# Patient Record
Sex: Male | Born: 1990 | Race: Black or African American | Hispanic: No | Marital: Single | Smoking: Former smoker
Health system: Southern US, Community
[De-identification: ages and names within clinical notes are randomized; demographics above are authoritative.]

## PROBLEM LIST (undated history)

## (undated) DIAGNOSIS — I10 Essential (primary) hypertension: Secondary | ICD-10-CM

## (undated) DIAGNOSIS — J45909 Unspecified asthma, uncomplicated: Secondary | ICD-10-CM

## (undated) DIAGNOSIS — E119 Type 2 diabetes mellitus without complications: Secondary | ICD-10-CM

## (undated) HISTORY — PX: LEG SURGERY: SHX1003

---

## 2009-09-21 ENCOUNTER — Emergency Department (HOSPITAL_COMMUNITY): Admission: EM | Admit: 2009-09-21 | Discharge: 2009-09-21 | Payer: Self-pay | Admitting: Emergency Medicine

## 2013-02-06 ENCOUNTER — Emergency Department (HOSPITAL_COMMUNITY)
Admission: EM | Admit: 2013-02-06 | Discharge: 2013-02-06 | Disposition: A | Discharge status: Home / Self Care | Payer: Self-pay | Attending: Emergency Medicine | Admitting: Emergency Medicine

## 2013-02-06 ENCOUNTER — Encounter (HOSPITAL_COMMUNITY): Payer: Self-pay | Admitting: Emergency Medicine

## 2013-02-06 DIAGNOSIS — X19XXXA Contact with other heat and hot substances, initial encounter: Secondary | ICD-10-CM | POA: Insufficient documentation

## 2013-02-06 DIAGNOSIS — Z79899 Other long term (current) drug therapy: Secondary | ICD-10-CM | POA: Insufficient documentation

## 2013-02-06 DIAGNOSIS — E119 Type 2 diabetes mellitus without complications: Secondary | ICD-10-CM | POA: Insufficient documentation

## 2013-02-06 DIAGNOSIS — Y93G3 Activity, cooking and baking: Secondary | ICD-10-CM | POA: Insufficient documentation

## 2013-02-06 DIAGNOSIS — J45909 Unspecified asthma, uncomplicated: Secondary | ICD-10-CM | POA: Insufficient documentation

## 2013-02-06 DIAGNOSIS — T24209A Burn of second degree of unspecified site of unspecified lower limb, except ankle and foot, initial encounter: Secondary | ICD-10-CM | POA: Insufficient documentation

## 2013-02-06 DIAGNOSIS — T24202A Burn of second degree of unspecified site of left lower limb, except ankle and foot, initial encounter: Secondary | ICD-10-CM

## 2013-02-06 DIAGNOSIS — Y929 Unspecified place or not applicable: Secondary | ICD-10-CM | POA: Insufficient documentation

## 2013-02-06 DIAGNOSIS — I10 Essential (primary) hypertension: Secondary | ICD-10-CM | POA: Insufficient documentation

## 2013-02-06 HISTORY — DX: Unspecified asthma, uncomplicated: J45.909

## 2013-02-06 HISTORY — DX: Type 2 diabetes mellitus without complications: E11.9

## 2013-02-06 HISTORY — DX: Essential (primary) hypertension: I10

## 2013-02-06 MED ORDER — SILVER SULFADIAZINE 1 % EX CREA: TOPICAL_CREAM | Freq: Every day | CUTANEOUS | Status: DC

## 2013-02-06 MED ORDER — OXYCODONE-ACETAMINOPHEN 5-325 MG PO TABS
2.0000 | ORAL_TABLET | Freq: Once | ORAL | Status: AC
  Administered 2013-02-06: 2 via ORAL
  Filled 2013-02-06: qty 2

## 2013-02-06 MED ORDER — SILVER SULFADIAZINE 1 % EX CREA
TOPICAL_CREAM | Freq: Once | CUTANEOUS | Status: AC
  Administered 2013-02-06: 1 via TOPICAL
  Filled 2013-02-06: qty 85

## 2013-02-06 MED ORDER — HYDROCODONE-ACETAMINOPHEN 5-325 MG PO TABS: 1.0000 | ORAL_TABLET | Freq: Four times a day (QID) | ORAL | Status: DC | PRN
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
## 2013-02-06 NOTE — Discharge Instructions (Signed)
Apply Silvadene as prescribed follow by application of a clean dry dressing. Recommend ice to the affected area to prevent further swelling or inflammation. You may take ibuprofen 600 mg every 6 hours as needed for mild to moderate pain. You may take Norco as prescribed for severe pain. Return to emergency department if symptoms worsen such as if you develop a fever, signs of infection develop, increased numbness to your left lower extremity, if your skin becomes pale, or if your left leg becomes cold to the touch.  Burn Care Your skin is a natural barrier to infection. It is the largest organ of your body. Burns damage this natural protection. To help prevent infection, it is very important to follow your caregiver's instructions in the care of your burn. Burns are classified as:  First degree. There is only redness of the skin (erythema). No scarring is expected.  Second degree. There is blistering of the skin. Scarring may occur with deeper burns.  Third degree. All layers of the skin are injured, and scarring is expected. HOME CARE INSTRUCTIONS   Wash your hands well before changing your bandage.  Change your bandage as often as directed by your caregiver.  Remove the old bandage. If the bandage sticks, you may soak it off with cool, clean water.  Cleanse the burn thoroughly but gently with mild soap and water.  Pat the area dry with a clean, dry cloth.  Apply a thin layer of antibacterial cream to the burn.  Apply a clean bandage as instructed by your caregiver.  Keep the bandage as clean and dry as possible.  Elevate the affected area for the first 24 hours, then as instructed by your caregiver.  Only take over-the-counter or prescription medicines for pain, discomfort, or fever as directed by your caregiver. SEEK IMMEDIATE MEDICAL CARE IF:   You develop excessive pain.  You develop redness, tenderness, swelling, or red streaks near the burn.  The burned area develops  yellowish-white fluid (pus) or a bad smell.  You have a fever. MAKE SURE YOU:   Understand these instructions.  Will watch your condition.  Will get help right away if you are not doing well or get worse. Document Released: 06/03/2005 Document Revised: 08/26/2011 Document Reviewed: 10/24/2010 [facility] Patient Information 2014 [place], [place].
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
## 2013-02-06 NOTE — ED Provider Notes (Signed)
Medical screening examination/treatment/procedure(s) were performed by non-physician practitioner and as supervising physician I was immediately available for consultation/collaboration.  Clela Hagadorn R. Alycea Segoviano, MD 02/06/13 2240 

## 2018-06-22 ENCOUNTER — Encounter (HOSPITAL_COMMUNITY): Payer: Self-pay | Admitting: *Deleted

## 2018-06-22 ENCOUNTER — Inpatient Hospital Stay (HOSPITAL_COMMUNITY)
Admission: EM | Admit: 2018-06-22 | Discharge: 2018-06-26 | DRG: 638 | Disposition: A | Discharge status: Home / Self Care | Payer: Self-pay | Attending: Internal Medicine | Admitting: Internal Medicine

## 2018-06-22 ENCOUNTER — Emergency Department (HOSPITAL_COMMUNITY): Payer: Self-pay

## 2018-06-22 ENCOUNTER — Other Ambulatory Visit: Payer: Self-pay

## 2018-06-22 DIAGNOSIS — E1110 Type 2 diabetes mellitus with ketoacidosis without coma: Principal | ICD-10-CM

## 2018-06-22 DIAGNOSIS — Z7984 Long term (current) use of oral hypoglycemic drugs: Secondary | ICD-10-CM

## 2018-06-22 DIAGNOSIS — E876 Hypokalemia: Secondary | ICD-10-CM | POA: Diagnosis present

## 2018-06-22 DIAGNOSIS — Z833 Family history of diabetes mellitus: Secondary | ICD-10-CM

## 2018-06-22 DIAGNOSIS — Z91040 Latex allergy status: Secondary | ICD-10-CM

## 2018-06-22 DIAGNOSIS — Z79899 Other long term (current) drug therapy: Secondary | ICD-10-CM

## 2018-06-22 DIAGNOSIS — Z6841 Body Mass Index (BMI) 40.0 and over, adult: Secondary | ICD-10-CM

## 2018-06-22 DIAGNOSIS — I10 Essential (primary) hypertension: Secondary | ICD-10-CM | POA: Diagnosis present

## 2018-06-22 DIAGNOSIS — Z9114 Patient's other noncompliance with medication regimen: Secondary | ICD-10-CM

## 2018-06-22 DIAGNOSIS — E860 Dehydration: Secondary | ICD-10-CM | POA: Diagnosis present

## 2018-06-22 DIAGNOSIS — J45909 Unspecified asthma, uncomplicated: Secondary | ICD-10-CM | POA: Diagnosis present

## 2018-06-22 DIAGNOSIS — R000 Tachycardia, unspecified: Secondary | ICD-10-CM | POA: Diagnosis present

## 2018-06-22 DIAGNOSIS — K219 Gastro-esophageal reflux disease without esophagitis: Secondary | ICD-10-CM | POA: Diagnosis present

## 2018-06-22 DIAGNOSIS — E1310 Other specified diabetes mellitus with ketoacidosis without coma: Secondary | ICD-10-CM

## 2018-06-22 HISTORY — DX: Type 2 diabetes mellitus with ketoacidosis without coma: E11.10

## 2018-06-22 LAB — CBC WITH DIFFERENTIAL/PLATELET
Abs Immature Granulocytes: 0.15 10*3/uL — ABNORMAL HIGH (ref 0.00–0.07)
BASOS ABS: 0.1 10*3/uL (ref 0.0–0.1)
BASOS PCT: 0 %
EOS PCT: 0 %
Eosinophils Absolute: 0 10*3/uL (ref 0.0–0.5)
HCT: 50.3 % (ref 39.0–52.0)
Hemoglobin: 16.5 g/dL (ref 13.0–17.0)
IMMATURE GRANULOCYTES: 1 %
Lymphocytes Relative: 9 %
Lymphs Abs: 1.6 10*3/uL (ref 0.7–4.0)
MCH: 29.9 pg (ref 26.0–34.0)
MCHC: 32.8 g/dL (ref 30.0–36.0)
MCV: 91.3 fL (ref 80.0–100.0)
Monocytes Absolute: 1.8 10*3/uL — ABNORMAL HIGH (ref 0.1–1.0)
Monocytes Relative: 11 %
NEUTROS PCT: 79 %
NRBC: 0 % (ref 0.0–0.2)
Neutro Abs: 13 10*3/uL — ABNORMAL HIGH (ref 1.7–7.7)
PLATELETS: 254 10*3/uL (ref 150–400)
RBC: 5.51 MIL/uL (ref 4.22–5.81)
RDW: 13.7 % (ref 11.5–15.5)
WBC: 16.6 10*3/uL — AB (ref 4.0–10.5)

## 2018-06-22 LAB — URINALYSIS, ROUTINE W REFLEX MICROSCOPIC
Bilirubin Urine: NEGATIVE
Glucose, UA: >=500 mg/dL — AB
Ketones, ur: 80 mg/dL — AB
LEUKOCYTES UA: NEGATIVE
NITRITE: NEGATIVE
PROTEIN: 100 mg/dL — AB
SPECIFIC GRAVITY, URINE: 1.022 (ref 1.005–1.030)
pH: 5 (ref 5.0–8.0)

## 2018-06-22 LAB — COMPREHENSIVE METABOLIC PANEL
ALT: 38 U/L (ref 0–44)
AST: 24 U/L (ref 15–41)
Albumin: 4.7 g/dL (ref 3.5–5.0)
Alkaline Phosphatase: 92 U/L (ref 38–126)
BILIRUBIN TOTAL: 2.1 mg/dL — AB (ref 0.3–1.2)
BUN: 10 mg/dL (ref 6–20)
CO2: <7 mmol/L — ABNORMAL LOW (ref 22–32)
Calcium: 9.3 mg/dL (ref 8.9–10.3)
Chloride: 101 mmol/L (ref 98–111)
Creatinine, Ser: 1.67 mg/dL — ABNORMAL HIGH (ref 0.61–1.24)
GFR, EST NON AFRICAN AMERICAN: 54 mL/min — AB (ref >60)
Glucose, Bld: 509 mg/dL (ref 70–99)
Potassium: 5.1 mmol/L (ref 3.5–5.1)
SODIUM: 131 mmol/L — AB (ref 135–145)
TOTAL PROTEIN: 9.1 g/dL — AB (ref 6.5–8.1)

## 2018-06-22 LAB — LIPASE, BLOOD: LIPASE: 57 U/L — AB (ref 11–51)

## 2018-06-22 LAB — I-STAT TROPONIN, ED: Troponin i, poc: 0 ng/mL (ref 0.00–0.08)

## 2018-06-22 LAB — CBG MONITORING, ED
GLUCOSE-CAPILLARY: 466 mg/dL — AB (ref 70–99)
Glucose-Capillary: 475 mg/dL — ABNORMAL HIGH (ref 70–99)
Glucose-Capillary: 387 mg/dL — ABNORMAL HIGH (ref 70–99)
Glucose-Capillary: 340 mg/dL — ABNORMAL HIGH (ref 70–99)
Glucose-Capillary: 314 mg/dL — ABNORMAL HIGH (ref 70–99)
Glucose-Capillary: 277 mg/dL — ABNORMAL HIGH (ref 70–99)
Glucose-Capillary: 261 mg/dL — ABNORMAL HIGH (ref 70–99)

## 2018-06-22 LAB — I-STAT CG4 LACTIC ACID, ED: Lactic Acid, Venous: 2.88 mmol/L (ref 0.5–1.9)

## 2018-06-22 MED ORDER — LORATADINE 10 MG PO TABS
10.0000 mg | ORAL_TABLET | Freq: Every day | ORAL | Status: DC
  Filled 2018-06-22 (×4): qty 1

## 2018-06-22 MED ORDER — ENOXAPARIN SODIUM 40 MG/0.4ML ~~LOC~~ SOLN
40.0000 mg | SUBCUTANEOUS | Status: DC
  Administered 2018-06-23: 40 mg via SUBCUTANEOUS
  Filled 2018-06-22: qty 0.4

## 2018-06-22 MED ORDER — INSULIN REGULAR(HUMAN) IN NACL 100-0.9 UT/100ML-% IV SOLN
INTRAVENOUS | Status: DC
  Administered 2018-06-22: 10.1 [IU]/h via INTRAVENOUS
  Filled 2018-06-22: qty 100

## 2018-06-22 MED ORDER — SODIUM CHLORIDE 0.9 % IV BOLUS
1000.0000 mL | Freq: Once | INTRAVENOUS | Status: AC
  Administered 2018-06-22: 1000 mL via INTRAVENOUS

## 2018-06-22 MED ORDER — INSULIN REGULAR(HUMAN) IN NACL 100-0.9 UT/100ML-% IV SOLN
INTRAVENOUS | Status: DC
  Administered 2018-06-22: 4.2 [IU]/h via INTRAVENOUS
  Filled 2018-06-22: qty 100

## 2018-06-22 MED ORDER — SODIUM CHLORIDE 0.9 % IV SOLN
INTRAVENOUS | Status: DC
  Administered 2018-06-22: via INTRAVENOUS

## 2018-06-22 MED ORDER — SODIUM CHLORIDE 0.9 % IV BOLUS
500.0000 mL | Freq: Once | INTRAVENOUS | Status: AC
  Administered 2018-06-22: 500 mL via INTRAVENOUS

## 2018-06-22 MED ORDER — DEXTROSE-NACL 5-0.45 % IV SOLN
INTRAVENOUS | Status: DC
  Administered 2018-06-22: via INTRAVENOUS

## 2018-06-22 MED ORDER — SODIUM CHLORIDE 0.9 % IV SOLN
INTRAVENOUS | Status: AC
  Administered 2018-06-22: via INTRAVENOUS

## 2018-06-22 NOTE — ED Triage Notes (Signed)
Pt in c/o acid reflux and fever since new years eve, denies cough, no distress noted

## 2018-06-22 NOTE — ED Provider Notes (Signed)
[facility] EMERGENCY DEPARTMENT Provider Note   CSN: [id number] Arrival date & time: 06/22/18  1113     History   Chief Complaint Chief Complaint  Patient presents with  . Gastroesophageal Reflux    HPI Louis [name] is a 28 y.o. male presenting for evaluation of polydipsia, polyuria, acid reflux, and overall weakness.   Pt states for the past 6 days, he has been feeling very poorly. He has tried to change his diet, drinking more water and decreasing his sodium and sugar intact. He states he is always thirsty and urinating frequently. He has acid-reflux like bringing at the epigastrium, which improved with nexium 2 days ago. He reports mild nausea and episodes of regurgitation. He states he has no medical problems, but upon discussion of his history, pt states haft in highschool he was on metformin for borderline/mild diabetes. He has not been on any medicine in the past few years, has no PCP. He denies fevers, chills, CP, SOB, abd pain, or abnormal BMs.   No additional history in the EMR for review.    HPI  Past Medical History:  Diagnosis Date  . Asthma   . Diabetes mellitus without complication (HCC)   . Hypertension     Patient Active Problem List   Diagnosis Date Noted  . Diabetic ketoacidosis (HCC) 06/22/2018    Past Surgical History:  Procedure Laterality Date  . LEG SURGERY          Home Medications    Prior to Admission medications   Medication Sig Start Date End Date Taking? Authorizing Provider  CALCIUM PO Take 1 tablet by mouth daily.    [provider]  HYDROcodone-acetaminophen (NORCO/VICODIN) 5-325 MG per tablet Take 1-2 tablets by mouth every 6 (six) hours as needed for pain. 02/06/13   Antony Madura, PA-C  loratadine (CLARITIN) 10 MG tablet Take 10 mg by mouth daily.    [provider]  Multiple Vitamins-Minerals (MULTIVITAMIN PO) Take 1 tablet by mouth daily.    [provider]  silver sulfADIAZINE  (SILVADENE) 1 % cream Apply topically daily. Apply to affected burn area twice per day and cover with a clean, dry gauze dressing. 02/06/13   Antony Madura, PA-C    Family History History reviewed. No pertinent family history.  Social History Social History   Tobacco Use  . Smoking status: Never Smoker  Substance Use Topics  . Alcohol use: Yes  . Drug use: Yes    Types: Marijuana     Allergies   Latex   Review of Systems Review of Systems  Constitutional: Positive for fatigue.  Gastrointestinal: Positive for nausea and vomiting.  Endocrine: Positive for polydipsia and polyuria.  Neurological: Positive for weakness.  All other systems reviewed and are negative.    Physical Exam Updated Vital Signs BP (!) 157/114   Pulse (!) 133   Temp 97.9 F (36.6 C) (Oral)   Resp (!) 28   SpO2 97%   Physical Exam Vitals signs and nursing note reviewed.  Constitutional:      Appearance: He is well-developed.     Comments: Obese male who appears dehydrated  HENT:     Head: Normocephalic and atraumatic.     Mouth/Throat:     Mouth: Mucous membranes are dry.  Eyes:     Conjunctiva/sclera: Conjunctivae normal.     Pupils: Pupils are equal, round, and reactive to light.  Neck:     Musculoskeletal: Normal range of motion and neck supple.  Cardiovascular:     Rate and Rhythm: Regular rhythm. Tachycardia present.     Comments: Tachycardic around 140 Pulmonary:     Effort: Pulmonary effort is normal. Tachypnea present. No respiratory distress.     Breath sounds: Normal breath sounds. No wheezing.     Comments: tachypneic around 26. Speaking in full sentences. Clear lung sounds in all fields.  Abdominal:     General: There is no distension.     Palpations: Abdomen is soft. There is no mass.     Tenderness: There is no abdominal tenderness. There is no guarding.     Comments: No TTP of the abd  Musculoskeletal: Normal range of motion.  Skin:    General: Skin is warm and dry.      Capillary Refill: Capillary refill takes less than 2 seconds.  Neurological:     Mental Status: He is alert and oriented to person, place, and time.      ED Treatments / Results  Labs (all labs ordered are listed, but only abnormal results are displayed) Labs Reviewed  COMPREHENSIVE METABOLIC PANEL - Abnormal; Notable for the following components:      Result Value   Sodium 131 (*)    CO2 <7 (*)    Glucose, Bld 509 (*)    Creatinine, Ser 1.67 (*)    Total Protein 9.1 (*)    Total Bilirubin 2.1 (*)    GFR calc non Af Amer 54 (*)    All other components within normal limits  CBC WITH DIFFERENTIAL/PLATELET - Abnormal; Notable for the following components:   WBC 16.6 (*)    Neutro Abs 13.0 (*)    Monocytes Absolute 1.8 (*)    Abs Immature Granulocytes 0.15 (*)    All other components within normal limits  URINALYSIS, ROUTINE W REFLEX MICROSCOPIC - Abnormal; Notable for the following components:   Color, Urine STRAW (*)    Glucose, UA >=500 (*)    Hgb urine dipstick MODERATE (*)    Ketones, ur 80 (*)    Protein, ur 100 (*)    Bacteria, UA RARE (*)    All other components within normal limits  LIPASE, BLOOD - Abnormal; Notable for the following components:   Lipase 57 (*)    All other components within normal limits  I-STAT CG4 LACTIC ACID, ED - Abnormal; Notable for the following components:   Lactic Acid, Venous 2.88 (*)    All other components within normal limits  CBG MONITORING, ED - Abnormal; Notable for the following components:   Glucose-Capillary 466 (*)    All other components within normal limits  BLOOD GAS, VENOUS  HEMOGLOBIN A1C  I-STAT TROPONIN, ED  I-STAT CG4 LACTIC ACID, ED    EKG EKG Interpretation  Date/Time:  Monday June 22 2018 12:18:35 EST Ventricular Rate:  131 PR Interval:    QRS Duration: 100 QT Interval:  292 QTC Calculation: 431 R Axis:   62 Text Interpretation:  Sinus tachycardia Nonspecific T abnormalities, inferior leads No old  tracing to compare Confirmed by Azalia Bilis ([id number]) on 06/22/2018 12:32:06 PM   Radiology Dg Chest 2 View  Result Date: 06/22/2018 CLINICAL DATA:  Midline chest pain, nausea/vomiting EXAM: CHEST - 2 VIEW COMPARISON:  None. FINDINGS: Lungs are clear.  No pleural effusion or pneumothorax. The heart is normal in size. Visualized osseous structures are within normal limits. IMPRESSION: Normal chest radiographs. Electronically Signed   By: Charline Bills M.D.   On: 06/22/2018 13:06  Procedures .Critical Care Performed by: Alveria Apleyaccavale, Tu Shimmel, PA-C Authorized by: Alveria Apleyaccavale, Gaetan Spieker, PA-C   Critical care provider statement:    Critical care time (minutes):  45   Critical care time was exclusive of:  Separately billable procedures and treating other patients and teaching time   Critical care was necessary to treat or prevent imminent or life-threatening deterioration of the following conditions:  Metabolic crisis   Critical care was time spent personally by me on the following activities:  Blood draw for specimens, development of treatment plan with patient or surrogate, evaluation of patient's response to treatment, examination of patient, obtaining history from patient or surrogate, ordering and performing treatments and interventions, ordering and review of radiographic studies, ordering and review of laboratory studies, pulse oximetry, re-evaluation of patient's condition and review of old charts   I assumed direction of critical care for this patient from another provider in my specialty: no   Comments:     Patient in DKA, insulin drip and fluids started.   (including critical care time)  Medications Ordered in ED Medications  insulin regular, human (MYXREDLIN) 100 units/ 100 mL infusion (has no administration in time range)  sodium chloride 0.9 % bolus 1,000 mL (has no administration in time range)  sodium chloride 0.9 % bolus 500 mL (0 mLs Intravenous Stopped 06/22/18 1423)     Initial  Impression / Assessment and Plan / ED Course  I have reviewed the triage vital signs and the nursing notes.  Pertinent labs & imaging results that were available during my care of the patient were reviewed by me and considered in my medical decision making (see chart for details).     Patient presenting for evaluation of generalized weakness, polyuria, polydipsia, and symptoms consistent with acid reflux.  On exam, I am concerned about his blood sugars.  He appears very dehydrated, is tachycardic, and has Kussmaul-like breathing.  I am concerned about diabetes, possible DKA.  As such, will obtain labs, urine, chest x-ray, EKG, and reassess. Fluids started.  Additionally, patient states he has been regurgitating food.  Consider possible bowel obstruction, although patient without history of abdominal surgeries.  Labs concerning, elevated white count at 16.6.  Glucose elevated at 509.  Bicarb less than 7 and anion gap on calculable.  Lactic mildly elevated.  Ketones in the urine.  As such, patient meets DKA criteria.  He is remaining persistently tachycardic.  Troponin negative, and EKG reassuring.  Chest x-ray viewed interpreted by me, no pneumonia, no pneumothorax, effusion, cardiomegaly.  Insulin drip started, and larger fluid bolus started.  Will obtain venous blood gas and hemoglobin A1c.  Case discussed with attending, Dr. Patria Maneampos evaluated the patient.  Will call for admission.  Discussed with Dr. Sharyon MedicusHijazi from [facility], patient to be admitted.   Final Clinical Impressions(s) / ED Diagnoses   Final diagnoses:  Diabetic ketoacidosis without coma associated with other specified diabetes mellitus [facility](HCC)    ED Discharge Orders    None       Alveria ApleyCaccavale, Mikail Goostree, PA-C 06/22/18 1524    Azalia Bilisampos, Kevin, MD 06/22/18 1705

## 2018-06-22 NOTE — ED Notes (Signed)
Patient transported to X-ray 

## 2018-06-22 NOTE — H&P (Signed)
[facility]                                                                                    Patient Demographics  Louis [name], is a 28 y.o. male  CSN: [id number]  MRN: [id number]  DOB - 09-01-1990  Admit Date - 06/22/2018  Outpatient Primary MD for the patient is Fleet ContrasAvbuere, Edwin, MD   With History of -  Past Medical History:  Diagnosis Date  . Asthma   . Diabetes mellitus without complication (HCC)   . Hypertension       Past Surgical History:  Procedure Laterality Date  . LEG SURGERY      in for   Chief Complaint  Patient presents with  . Gastroesophageal Reflux     HPI  Louis [name]  is a 28 y.o. male, with with past medical history significant for diabetes mellitus in childhood, noncompliant with his metformin presenting with 2 days history of polyuria, polydipsia, nausea and vomiting and GERD.  Patient denies any chest pains or shortness of breath. In the emergency room the patient was noted to be in DKA with elevated blood sugar and anion gap and a CO2 of less than 7.  His heart rate was 142 .    Review of Systems    In addition to the HPI above,  No Fever-chills, No Headache, No changes with Vision or hearing, No problems swallowing food or Liquids, No Chest pain, Cough or Shortness of Breath, No Abdominal pain, No Nausea or Vommitting, Bowel movements are regular, No Blood in stool or Urine, No dysuria, No new skin rashes or bruises, No new joints pains-aches,  No new weakness, tingling, numbness in any extremity, No recent weight gain or loss, No polyuria, polydypsia or polyphagia, No significant Mental Stressors.  A full 10 point Review of Systems was done, except as stated above, all other Review of Systems were negative.   Social History Social History   Tobacco Use  . Smoking status: Never Smoker  Substance Use Topics  . Alcohol use: Yes     Family History Diabetes mellitus  Prior to Admission medications    Medication Sig Start Date End Date Taking? Authorizing Provider  CALCIUM PO Take 1 tablet by mouth daily.    [provider]  HYDROcodone-acetaminophen (NORCO/VICODIN) 5-325 MG per tablet Take 1-2 tablets by mouth every 6 (six) hours as needed for pain. 02/06/13   Antony MaduraHumes, Kelly, PA-C  loratadine (CLARITIN) 10 MG tablet Take 10 mg by mouth daily.    [provider]  Multiple Vitamins-Minerals (MULTIVITAMIN PO) Take 1 tablet by mouth daily.    [provider]  silver sulfADIAZINE (SILVADENE) 1 % cream Apply topically daily. Apply to affected burn area twice per day and cover with a clean, dry gauze dressing. 02/06/13   Antony MaduraHumes, Kelly, PA-C    Allergies  Allergen Reactions  . Latex Rash    Physical Exam  Vitals  Blood pressure (!) 157/114, pulse (!) 133, temperature 97.9 F (36.6 C), temperature source Oral, resp. rate (!) 28, SpO2 97 %.   1. General obese male, very pleasant  2. Normal affect and insight, Not Suicidal  or Homicidal, Awake Alert, Oriented X 3.  3. No F.N deficits, grossly.  4. Ears and Eyes appear Normal, Conjunctivae clear, PERRLA. Moist Oral Mucosa.  5. Supple Neck, No JVD, No cervical lymphadenopathy appriciated, No Carotid Bruits.  6. Symmetrical Chest wall movement, Good air movement bilaterally, CTAB.  7. RRR, tachycardia.  8. Positive Bowel Sounds, Abdomen Soft, Non tender, No organomegaly appriciated,No rebound -guarding or rigidity.  9.  No Cyanosis, Normal Skin Turgor, No Skin Rash or Bruise.  10. Good muscle tone,  joints appear normal , no effusions, Normal ROM.    Data Review  CBC Recent Labs  Lab 06/22/18 1120  WBC 16.6*  HGB 16.5  HCT 50.3  PLT 254  MCV 91.3  MCH 29.9  MCHC 32.8  RDW 13.7  LYMPHSABS 1.6  MONOABS 1.8*  EOSABS 0.0  BASOSABS 0.1   ------------------------------------------------------------------------------------------------------------------  Chemistries  Recent Labs  Lab  06/22/18 1120  NA 131*  K 5.1  CL 101  CO2 <7*  GLUCOSE 509*  BUN 10  CREATININE 1.67*  CALCIUM 9.3  AST 24  ALT 38  ALKPHOS 92  BILITOT 2.1*   ------------------------------------------------------------------------------------------------------------------ CrCl cannot be calculated (Unknown ideal weight.). ------------------------------------------------------------------------------------------------------------------ No results for input(s): TSH, T4TOTAL, T3FREE, THYROIDAB in the last 72 hours.  Invalid input(s): FREET3   Coagulation profile No results for input(s): INR, PROTIME in the last 168 hours. ------------------------------------------------------------------------------------------------------------------- No results for input(s): DDIMER in the last 72 hours. -------------------------------------------------------------------------------------------------------------------  Cardiac Enzymes No results for input(s): CKMB, TROPONINI, MYOGLOBIN in the last 168 hours.  Invalid input(s): CK ------------------------------------------------------------------------------------------------------------------ Invalid input(s): POCBNP   ---------------------------------------------------------------------------------------------------------------  Urinalysis    Component Value Date/Time   COLORURINE STRAW (A) 06/22/2018 1120   APPEARANCEUR CLEAR 06/22/2018 1120   LABSPEC 1.022 06/22/2018 1120   PHURINE 5.0 06/22/2018 1120   GLUCOSEU >=500 (A) 06/22/2018 1120   HGBUR MODERATE (A) 06/22/2018 1120   BILIRUBINUR NEGATIVE 06/22/2018 1120   KETONESUR 80 (A) 06/22/2018 1120   PROTEINUR 100 (A) 06/22/2018 1120   NITRITE NEGATIVE 06/22/2018 1120   LEUKOCYTESUR NEGATIVE 06/22/2018 1120    ----------------------------------------------------------------------------------------------------------------   Imaging results:   Dg Chest 2 View  Result Date:  06/22/2018 CLINICAL DATA:  Midline chest pain, nausea/vomiting EXAM: CHEST - 2 VIEW COMPARISON:  None. FINDINGS: Lungs are clear.  No pleural effusion or pneumothorax. The heart is normal in size. Visualized osseous structures are within normal limits. IMPRESSION: Normal chest radiographs. Electronically Signed   By: Charline Bills M.D.   On: 06/22/2018 13:06    My personal review of EKG: Sinus tach at 131  Assessment & Plan  Diabetic ketoacidosis Start with IV insulin/IV fluids per protocol  GERD Protonix  Sinus tachycardia , probably dehydration Continue with IV fluids    DVT Prophylaxis Lovenox  AM Labs Ordered, also please review Full Orders    Code Status full  Disposition Plan: Home  Time spent in minutes : 36 minutes  Condition GUARDED   @SIGNATURE @

## 2018-06-23 ENCOUNTER — Encounter (HOSPITAL_COMMUNITY): Payer: Self-pay

## 2018-06-23 DIAGNOSIS — E1310 Other specified diabetes mellitus with ketoacidosis without coma: Secondary | ICD-10-CM

## 2018-06-23 LAB — HEMOGLOBIN A1C
Hgb A1c MFr Bld: 11.6 % — ABNORMAL HIGH (ref 4.8–5.6)
Mean Plasma Glucose: 286.22 mg/dL

## 2018-06-23 LAB — BASIC METABOLIC PANEL
ANION GAP: 14 (ref 5–15)
ANION GAP: 15 (ref 5–15)
Anion gap: 15 (ref 5–15)
Anion gap: 15 (ref 5–15)
Anion gap: 14 (ref 5–15)
BUN: 9 mg/dL (ref 6–20)
BUN: 9 mg/dL (ref 6–20)
BUN: 7 mg/dL (ref 6–20)
BUN: 9 mg/dL (ref 6–20)
BUN: 7 mg/dL (ref 6–20)
CALCIUM: 9.6 mg/dL (ref 8.9–10.3)
CO2: 11 mmol/L — ABNORMAL LOW (ref 22–32)
CO2: 13 mmol/L — ABNORMAL LOW (ref 22–32)
CO2: 7 mmol/L — ABNORMAL LOW (ref 22–32)
CO2: 8 mmol/L — ABNORMAL LOW (ref 22–32)
CO2: 10 mmol/L — ABNORMAL LOW (ref 22–32)
Calcium: 9.1 mg/dL (ref 8.9–10.3)
Calcium: 9.4 mg/dL (ref 8.9–10.3)
Calcium: 9.6 mg/dL (ref 8.9–10.3)
Calcium: 9.3 mg/dL (ref 8.9–10.3)
Chloride: 107 mmol/L (ref 98–111)
Chloride: 108 mmol/L (ref 98–111)
Chloride: 112 mmol/L — ABNORMAL HIGH (ref 98–111)
Chloride: 113 mmol/L — ABNORMAL HIGH (ref 98–111)
Chloride: 108 mmol/L (ref 98–111)
Creatinine, Ser: 1.14 mg/dL (ref 0.61–1.24)
Creatinine, Ser: 1.13 mg/dL (ref 0.61–1.24)
Creatinine, Ser: 1.05 mg/dL (ref 0.61–1.24)
Creatinine, Ser: 1.1 mg/dL (ref 0.61–1.24)
Creatinine, Ser: 0.96 mg/dL (ref 0.61–1.24)
GFR calc Af Amer: >60 mL/min (ref >60)
GFR calc Af Amer: >60 mL/min (ref >60)
GFR calc Af Amer: >60 mL/min (ref >60)
GFR calc Af Amer: >60 mL/min (ref >60)
GFR calc Af Amer: >60 mL/min (ref >60)
GFR calc non Af Amer: >60 mL/min (ref >60)
GFR calc non Af Amer: >60 mL/min (ref >60)
GFR calc non Af Amer: >60 mL/min (ref >60)
GFR calc non Af Amer: >60 mL/min (ref >60)
GLUCOSE: 182 mg/dL — AB (ref 70–99)
GLUCOSE: 193 mg/dL — AB (ref 70–99)
Glucose, Bld: 196 mg/dL — ABNORMAL HIGH (ref 70–99)
Glucose, Bld: 218 mg/dL — ABNORMAL HIGH (ref 70–99)
Glucose, Bld: 163 mg/dL — ABNORMAL HIGH (ref 70–99)
POTASSIUM: 3.7 mmol/L (ref 3.5–5.1)
Potassium: 3.5 mmol/L (ref 3.5–5.1)
Potassium: 5 mmol/L (ref 3.5–5.1)
Potassium: 4.2 mmol/L (ref 3.5–5.1)
Potassium: 4 mmol/L (ref 3.5–5.1)
Sodium: 133 mmol/L — ABNORMAL LOW (ref 135–145)
Sodium: 135 mmol/L (ref 135–145)
Sodium: 134 mmol/L — ABNORMAL LOW (ref 135–145)
Sodium: 135 mmol/L (ref 135–145)
Sodium: 133 mmol/L — ABNORMAL LOW (ref 135–145)

## 2018-06-23 LAB — GLUCOSE, CAPILLARY
GLUCOSE-CAPILLARY: 192 mg/dL — AB (ref 70–99)
GLUCOSE-CAPILLARY: 125 mg/dL — AB (ref 70–99)
Glucose-Capillary: 143 mg/dL — ABNORMAL HIGH (ref 70–99)
Glucose-Capillary: 148 mg/dL — ABNORMAL HIGH (ref 70–99)
Glucose-Capillary: 151 mg/dL — ABNORMAL HIGH (ref 70–99)
Glucose-Capillary: 152 mg/dL — ABNORMAL HIGH (ref 70–99)
Glucose-Capillary: 160 mg/dL — ABNORMAL HIGH (ref 70–99)
Glucose-Capillary: 162 mg/dL — ABNORMAL HIGH (ref 70–99)
Glucose-Capillary: 164 mg/dL — ABNORMAL HIGH (ref 70–99)
Glucose-Capillary: 168 mg/dL — ABNORMAL HIGH (ref 70–99)
Glucose-Capillary: 170 mg/dL — ABNORMAL HIGH (ref 70–99)
Glucose-Capillary: 173 mg/dL — ABNORMAL HIGH (ref 70–99)
Glucose-Capillary: 177 mg/dL — ABNORMAL HIGH (ref 70–99)
Glucose-Capillary: 188 mg/dL — ABNORMAL HIGH (ref 70–99)
Glucose-Capillary: 188 mg/dL — ABNORMAL HIGH (ref 70–99)
Glucose-Capillary: 199 mg/dL — ABNORMAL HIGH (ref 70–99)
Glucose-Capillary: 201 mg/dL — ABNORMAL HIGH (ref 70–99)
Glucose-Capillary: 205 mg/dL — ABNORMAL HIGH (ref 70–99)
Glucose-Capillary: 207 mg/dL — ABNORMAL HIGH (ref 70–99)
Glucose-Capillary: 241 mg/dL — ABNORMAL HIGH (ref 70–99)

## 2018-06-23 LAB — MRSA PCR SCREENING: MRSA by PCR: NEGATIVE

## 2018-06-23 LAB — HIV ANTIBODY (ROUTINE TESTING W REFLEX): HIV Screen 4th Generation wRfx: NONREACTIVE

## 2018-06-23 MED ORDER — ADULT MULTIVITAMIN W/MINERALS CH
1.0000 | ORAL_TABLET | Freq: Every day | ORAL | Status: DC
  Administered 2018-06-23 – 2018-06-25 (×3): 1 via ORAL
  Filled 2018-06-23 (×4): qty 1

## 2018-06-23 MED ORDER — INSULIN ASPART 100 UNIT/ML ~~LOC~~ SOLN: 0.0000 [IU] | Freq: Three times a day (TID) | SUBCUTANEOUS | Status: DC

## 2018-06-23 MED ORDER — INSULIN GLARGINE 100 UNIT/ML ~~LOC~~ SOLN
12.0000 [IU] | Freq: Every day | SUBCUTANEOUS | Status: DC
  Administered 2018-06-23: 12 [IU] via SUBCUTANEOUS
  Filled 2018-06-23 (×2): qty 0.12

## 2018-06-23 MED ORDER — ENOXAPARIN SODIUM 100 MG/ML ~~LOC~~ SOLN
100.0000 mg | SUBCUTANEOUS | Status: DC
  Administered 2018-06-24 – 2018-06-25 (×2): 100 mg via SUBCUTANEOUS
  Filled 2018-06-23 (×2): qty 1

## 2018-06-23 MED ORDER — INSULIN ASPART 100 UNIT/ML ~~LOC~~ SOLN: 0.0000 [IU] | Freq: Every day | SUBCUTANEOUS | Status: DC

## 2018-06-23 MED ORDER — INSULIN REGULAR(HUMAN) IN NACL 100-0.9 UT/100ML-% IV SOLN
INTRAVENOUS | Status: DC
  Administered 2018-06-23: 7.7 [IU]/h via INTRAVENOUS
  Administered 2018-06-24: 13.4 [IU]/h via INTRAVENOUS
  Administered 2018-06-24: 7.1 [IU]/h via INTRAVENOUS
  Filled 2018-06-23 (×4): qty 100

## 2018-06-23 MED ORDER — INSULIN REGULAR BOLUS VIA INFUSION
0.0000 [IU] | Freq: Three times a day (TID) | INTRAVENOUS | Status: DC
  Administered 2018-06-23: 1 [IU] via INTRAVENOUS
  Administered 2018-06-24: 2.6 [IU] via INTRAVENOUS
  Administered 2018-06-24: 2.4 [IU] via INTRAVENOUS
  Administered 2018-06-24: 2.5 [IU] via INTRAVENOUS
  Filled 2018-06-23: qty 10

## 2018-06-23 MED ORDER — DEXTROSE-NACL 5-0.45 % IV SOLN
INTRAVENOUS | Status: DC
  Administered 2018-06-23 – 2018-06-25 (×3): via INTRAVENOUS

## 2018-06-23 MED ORDER — SODIUM CHLORIDE 0.9 % IV SOLN: INTRAVENOUS | Status: DC

## 2018-06-23 MED ORDER — ACETAMINOPHEN 325 MG PO TABS
650.0000 mg | ORAL_TABLET | Freq: Four times a day (QID) | ORAL | Status: DC | PRN
  Filled 2018-06-23: qty 2

## 2018-06-23 MED ORDER — INSULIN STARTER KIT- SYRINGES (ENGLISH)
1.0000 | Freq: Once | Status: AC
  Administered 2018-06-23: 1
  Filled 2018-06-23: qty 1

## 2018-06-23 MED ORDER — METFORMIN HCL 850 MG PO TABS
850.0000 mg | ORAL_TABLET | Freq: Two times a day (BID) | ORAL | Status: DC
  Administered 2018-06-23: 850 mg via ORAL
  Filled 2018-06-23 (×2): qty 1

## 2018-06-23 MED ORDER — LIVING WELL WITH DIABETES BOOK
Freq: Once | Status: AC
  Administered 2018-06-23: 11:00:00
  Filled 2018-06-23: qty 1

## 2018-06-23 MED ORDER — DEXTROSE 50 % IV SOLN: 25.0000 mL | INTRAVENOUS | Status: DC | PRN

## 2018-06-23 NOTE — Progress Notes (Signed)
PROGRESS NOTE                                                                                                                                                                                                             Patient Demographics:    Louis [name], is a 28 y.o. male, DOB - 1990/11/30, ZOX:[id number]  Admit date - 06/22/2018   Admitting Physician Carron CurieAli Hijazi, MD  Outpatient Primary MD for the patient is Fleet ContrasAvbuere, Edwin, MD  LOS - 1   Chief Complaint  Patient presents with  . Gastroesophageal Reflux       Brief Narrative     28 y.o. 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  In the emergency room the patient was noted to be in DKA with elevated blood sugar and anion gap and a CO2 of less than 7.  His heart rate was 142 .  Added for DKA management.    Subjective:    Louis [name] today has, No headache, No chest pain, No abdominal pain - No Nausea, No new weakness tingling or numbness, No Cough - SOB.   Assessment  & Plan :    Active Problems:   Diabetic ketoacidosis (HCC)  DKA -Report diagnosis of prediabetes when he was 28 years old, use metformin until he was 28 years old, but no other diabetic regimen since he presents with low bicarb, elevated anion gap, diagnosis for DKA, unclear if this is related to type I or type 2 diabetes, but this is most likely early onset type 2 diabetes in the setting of morbid obesity. -Patient on insulin drip overnight, his repeat BMP this morning showing anion gap of 14, and bicarb of 13, was started on subcutaneous Lantus, insulin sliding scale, protocol not appropriate to turn the drip yet, so he will be resumed back on insulin drip for today, will close monitoring of his BMP. -Continue with IV fluids. -Likely will need to be discharged on long-acting insulin, and metformin,  cussed with case management, who will [facility]. -A1C elevated at 11.6  GERD Protonix  Sinus tachycardia , probably dehydration Continue with IV fluids   Code Status :Full  Family Communication  : None at bedside  Disposition Plan  :home when stable  Barriers For Discharge : Remains elevated anion gap, low bicarb level, still needs saline drip  Consults  :  None  Procedures  : None  DVT Prophylaxis  :  [place] lovenox  Lab Results  Component Value Date   PLT 254 06/22/2018    Antibiotics  :    Anti-infectives (From admission, onward)   None        Objective:   Vitals:   06/23/18 0000 06/23/18 0010 06/23/18 0608 06/23/18 0844  BP:   (!) 146/96 (!) 142/106  Pulse: (!) 113  (!) 112 (!) 114  Resp: (!) 25   15  Temp:    98.5 F (36.9 C)  TempSrc:    Oral  SpO2: 98%  97% 99%  Weight:  (!) 209.6 kg    Height:  5\' 10"  (1.778 m)      Wt Readings from Last 3 Encounters:  06/23/18 (!) 209.6 kg  02/06/13 (!) 191.7 kg     Intake/Output Summary (Last 24 hours) at 06/23/2018 1244 Last data filed at 06/23/2018 0323 Gross per 24 hour  Intake 2859.76 ml  Output 2430 ml  Net 429.76 ml     Physical Exam  Awake Alert, Oriented X 3, No new F.N deficits, Normal affect Symmetrical Chest wall movement, Good air movement bilaterally, CTAB RRR,No Gallops,Rubs or new Murmurs, No Parasternal Heave +ve B.Sounds, Abd Soft, No tenderness, No rebound - guarding or rigidity. No Cyanosis, Clubbing or edema, No new Rash or bruise      Data Review:    CBC Recent Labs  Lab 06/22/18 1120  WBC 16.6*  HGB 16.5  HCT 50.3  PLT 254  MCV 91.3  MCH 29.9  MCHC 32.8  RDW 13.7  LYMPHSABS 1.6  MONOABS 1.8*  EOSABS 0.0  BASOSABS 0.1    Chemistries  Recent Labs  Lab 06/22/18 1120 06/23/18 0432 06/23/18 0759  NA 131* 133* 135  K 5.1 3.7 3.5  CL 101 107 108  CO2 <7* 11* 13*  GLUCOSE 509* 196* 182*  BUN 10 9 9   CREATININE  1.67* 1.14 1.13  CALCIUM 9.3 9.1 9.4  AST 24  --   --   ALT 38  --   --   ALKPHOS 92  --   --   BILITOT 2.1*  --   --    ------------------------------------------------------------------------------------------------------------------ No results for input(s): CHOL, HDL, LDLCALC, TRIG, CHOLHDL, LDLDIRECT in the last 72 hours.  Lab Results  Component Value Date   HGBA1C 11.6 (H) 06/23/2018   ------------------------------------------------------------------------------------------------------------------ No results for input(s): TSH, T4TOTAL, T3FREE, THYROIDAB in the last 72 hours.  Invalid input(s): FREET3 ------------------------------------------------------------------------------------------------------------------ No results for input(s): VITAMINB12, FOLATE, FERRITIN, TIBC, IRON, RETICCTPCT in the last 72 hours.  Coagulation profile No results for input(s): INR, PROTIME in the last 168 hours.  No results for input(s): DDIMER in the last 72 hours.  Cardiac Enzymes No results for input(s): CKMB, TROPONINI, MYOGLOBIN in the last 168 hours.  Invalid input(s): CK ------------------------------------------------------------------------------------------------------------------ No results found for: BNP  Inpatient Medications  Scheduled Meds: . enoxaparin (LOVENOX) injection  40 mg Subcutaneous Q24H  . insulin glargine  12 Units Subcutaneous Daily  . insulin regular  0-10 Units Intravenous TID WC  . loratadine  10 mg Oral Daily  . metFORMIN  850 mg Oral BID WC   Continuous Infusions: . sodium chloride 150 mL/hr at 06/23/18 1048  . sodium chloride    . dextrose 5 % and 0.45%  NaCl    . insulin     PRN Meds:.dextrose  Micro Results Recent Results (from the past 240 hour(s))  MRSA PCR Screening     Status: None   Collection Time: 06/22/18 11:55 PM  Result Value Ref Range Status   MRSA by PCR NEGATIVE NEGATIVE Final    Comment:        The GeneXpert MRSA Assay  (FDA approved for NASAL specimens only), is one component of a comprehensive MRSA colonization surveillance program. It is not intended to diagnose MRSA infection nor to guide or monitor treatment for MRSA infections. Performed at [facility], [address] [place], [place] [address]     Radiology Reports Dg Chest 2 View  Result Date: 06/22/2018 CLINICAL DATA:  Midline chest pain, nausea/vomiting EXAM: CHEST - 2 VIEW COMPARISON:  None. FINDINGS: Lungs are clear.  No pleural effusion or pneumothorax. The heart is normal in size. Visualized osseous structures are within normal limits. IMPRESSION: Normal chest radiographs. Electronically Signed   By: Charline BillsSriyesh  Krishnan M.D.   On: 06/22/2018 13:06     Huey Bienenstockawood Iriel Nason M.D on 06/23/2018 at 12:44 PM  Between 7am to 7pm - Pager - [phone]  After 7pm go to [email or web address] - password [email or web address]  [facility] -  Office  [phone]

## 2018-06-23 NOTE — Progress Notes (Signed)
Ok to increase lovenox to 0.5mg /kg for DVT prophylaxis. Pt wt = 210kg  Plan  Lovenox 100mg  SQ q24  Ulyses Southward, PharmD, [place], AAHIVP, CPP Infectious Disease Pharmacist 06/23/2018 2:45 PM

## 2018-06-23 NOTE — Progress Notes (Signed)
Initial Nutrition Assessment  DOCUMENTATION CODES:   Morbid obesity  INTERVENTION:   - Provided education and handout (Consistent Carbohydrate Nutrition Therapy from the Academy of Nutrition and Dietetics) regarding consistent carbohydrate nutrition therapy for diabetes  - MVI with minerals daily  NUTRITION DIAGNOSIS:   Food and nutrition related knowledge deficit related to other (no prior education) as evidenced by per patient/family report, other (hemoglobin A1C 11.6%).  GOAL:   Patient will meet greater than or equal to 90% of their needs  MONITOR:   PO intake, Weight trends, Labs  REASON FOR ASSESSMENT:   Malnutrition Screening Tool, Consult    ASSESSMENT:   28 year old male who presented to the ED on 1/6 with polyuria, polydipsia, nausea, vomiting, and GERD. PMH significant for DM not taking Metformin and GERD. Pt noted to be in DKA.  Spoke with pt at bedside. Pt reports poor PO intake for a "few days" PTA due to headache, nausea, vomiting. Pt reports that when he is feeling well, he has a good appetite. Pt shares that he recently changed his diet, cutting out sugar-sweetened beverages (tea, soda, juice), decreasing intake of added sugar, and decreasing sodium intake.  Pt reports typically eating 2-3 meals daily.  Breakfast: fruit, diet ginger ale, avocado toast Lunch: salad Dinner: soup or baked chicken with steamed rice and vegetable medley  Pt states that he has been losing weight and that he weighed 520 lbs "before Christmas." Current weight is 462 lbs. Will continue to monitor weight trends during admission.  Pt states that he is active with walking daily to check his mailbox which he reports is "on the other side of my apartment complex." RD encouraged regular physical activity as a method of managing blood sugars and a component of a general, healthful lifestyle.  RD consulted for nutrition education regarding diabetes.   RD provided "Carbohydrate Counting  for People with Diabetes" handout from the Academy of Nutrition and Dietetics. Discussed different food groups and their effects on blood sugar, emphasizing carbohydrate-containing foods. Provided list of carbohydrates and recommended serving sizes of common foods.  Discussed importance of controlled and consistent carbohydrate intake throughout the day. Provided examples of ways to balance meals/snacks and encouraged intake of high-fiber, whole grain complex carbohydrates. Teach back method used.  Expect good compliance.  Medications reviewed and include: D5 in NS @ 100 ml/hr, regular insulin drip  Labs reviewed. CBG's: 152, 160, 192, 173, 188, 207, 188, 177, 151 x 12 hours Hemoglobin A1C 11.6 (H)  UOP: 2430 ml x 24 hours  NUTRITION - FOCUSED PHYSICAL EXAM:    Most Recent Value  Orbital Region  No depletion  Upper Arm Region  No depletion  Thoracic and Lumbar Region  No depletion  Buccal Region  No depletion  Temple Region  No depletion  Clavicle Bone Region  No depletion  Clavicle and Acromion Bone Region  No depletion  Scapular Bone Region  No depletion  Dorsal Hand  No depletion  Patellar Region  No depletion  Anterior Thigh Region  No depletion  Posterior Calf Region  No depletion  Hair  Reviewed  Eyes  Reviewed  Mouth  Reviewed  Skin  Reviewed  Nails  Reviewed       Diet Order:   Diet Order            Diet Carb Modified Fluid consistency: Thin; Room service appropriate? Yes  Diet effective now              EDUCATION NEEDS:  Education needs have been addressed  Skin:  Skin Assessment: Reviewed RN Assessment  Last BM:  PTA  Height:   Ht Readings from Last 1 Encounters:  06/23/18 5\' 10"  (1.778 m)    Weight:   Wt Readings from Last 1 Encounters:  06/23/18 (!) 209.6 kg    Ideal Body Weight:  75.5 kg  BMI:  Body mass index is 66.31 kg/m.  Estimated Nutritional Needs:   Kcal:  2600-2800  Protein:  130-145 grams  Fluid:  >/= 2.6  L    Earma ReadingKate Jablonski Takyla Kuchera, MS, RD, LDN Inpatient Clinical Dietitian Pager: [phone] Weekend/After Hours: [phone]

## 2018-06-23 NOTE — Discharge Instructions (Signed)
Fingerstick glucose (sugar) goals for home: °Before meals: 80-130 mg/dl °2-Hours after meals: less than 180 mg/dl °Hemoglobin A1c goal: 7% or less ° °Symptoms of Hypoglycemia: °Silly, Sweaty, Shaky °Check sugar if you have your meter.  If near or less than 70 mg/dl, treat with 1/2 cup juice or soda or take glucose tablets °Check sugar 15 minutes after treatment.  If sugar still near or less than 70 mg/al and symptomatic, treat again and may need a snack with some protein (peanut butter with crackers, etc) ° ° ° °

## 2018-06-23 NOTE — Progress Notes (Signed)
Inpatient Diabetes Program Recommendations  AACE/ADA: New Consensus Statement on Inpatient Glycemic Control (2015)  Target Ranges:  Prepandial:   less than 140 mg/dL      Peak postprandial:   less than 180 mg/dL (1-2 hours)      Critically ill patients:  140 - 180 mg/dL   Results for Louis [name], Louis [name] (MRN [id number]) as of 06/23/2018 09:20  Ref. Range 06/23/2018 00:46 06/23/2018 01:53 06/23/2018 02:59 06/23/2018 04:11 06/23/2018 05:05 06/23/2018 06:06 06/23/2018 07:24 06/23/2018 08:35  Glucose-Capillary Latest Ref Range: 70 - 99 mg/dL 379 (H) 024 (H) 097 (H) 207 (H) 188 (H) 173 (H) 192 (H) 160 (H)   Results for Louis [name], Louis [name] (MRN [id number]) as of 06/23/2018 09:20  Ref. Range 06/22/2018 11:20  Sodium Latest Ref Range: 135 - 145 mmol/L 131 (L)  Potassium Latest Ref Range: 3.5 - 5.1 mmol/L 5.1  Chloride Latest Ref Range: 98 - 111 mmol/L 101  CO2 Latest Ref Range: 22 - 32 mmol/L <7 (L)  Glucose Latest Ref Range: 70 - 99 mg/dL 683 (HH)  BUN Latest Ref Range: 6 - 20 mg/dL 10  Creatinine Latest Ref Range: 0.61 - 1.24 mg/dL 4.19 (H)  Calcium Latest Ref Range: 8.9 - 10.3 mg/dL 9.3  Anion gap Latest Ref Range: 5 - 15  NOT CALCULATED   Results for Louis [name], Louis [name] (MRN [id number]) as of 06/23/2018 09:20  Ref. Range 06/23/2018 04:32  Hemoglobin A1C Latest Ref Range: 4.8 - 5.6 % 11.6 (H)  (286 mg/dl)   Admit with: DKA  History: DM (borderline Diabetes diagnosed in high school)  Home DM Meds: Metformin (NOT taking)  Current Orders: IV Insulin Drip (started yest at 3:30pm)     Note that 8am BMET today shows Anion Gap= 14 and CO2= 13.  Not ready to transition to SQ Insulin yet per DKA protocol parameters.  NO PCP, NO Insurance listed--Have placed Care Management consult.       --Will follow patient during hospitalization--  Ambrose Finland RN, MSN, CDE Diabetes Coordinator Inpatient Glycemic Control Team Team Pager: [phone] (8a-5p)

## 2018-06-23 NOTE — Care Management Note (Addendum)
Case Management Note  Patient Details  Name: Louis [name] MRN: [id number] Date of Birth: Sep 14, 1990  Subjective/Objective:           DKA        06/26/2018 @ 0930  TOC pharmacy to deliver filled Rx meds to bedside. Match Letter given per NCM to assist with med.cost, pt with insurance/ limited funds. Pt aware and states can afford $ 3.00 copay cost each med prescibed.   Action/Plan: Transition to home when medically stable. 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 Rx meds will be faxed to [facility] pharmacy for pt pick up. Match Letter will be faxed also to assist with pt cost once d/c.... NCM to f/u with TOC needs.   Pt states has transportation to home.  Post hospital f/u , [facility].  [facility]   [phone] [address] [place] [address]    Next Steps: Go on 07/09/2018    Instructions: 3:10 pm , Dr. Cain Saupe         Expected Discharge Date:    06/24/2018              Expected Discharge Plan:  Home/Self Care  In-House Referral:  NA  Discharge planning Services  CM Consult, MATCH Program, [facility], Follow-up appt scheduled  Post Acute Care Choice:  NA Choice offered to:  NA  DME Arranged:  N/A DME Agency:  NA  HH Arranged:  NA HH Agency:  NA  Status of Service:  Completed, signed off  If discussed at Long Length of Stay Meetings, dates discussed:    Additional Comments:  Epifanio Lesches, RN 06/23/2018, 12:39 PM

## 2018-06-23 NOTE — Progress Notes (Signed)
Note that IV Insulin drip restarting at ~1:30pm today.  Met with pt this afternoon.  Pt told me he had "borderline diabetes" in high school and took Metformin until age 27.  Has not been on any meds since then.  Takes care of his grandfather who has diabetes and requires insulin injections (father of pt also has diabetes).  Gives grandfather several injections per day and knows how to check his grandfather's CBGs as well.  Told me the RD came by earlier today and provided diet education as well.  Spoke with pt about new diagnosis.  Discussed A1C results with him and explained what an A1C is, basic pathophysiology of DM Type 2, basic home care, basic diabetes diet nutrition principles, importance of checking CBGs and maintaining good CBG control to prevent long-term and short-term complications.  Also reviewed blood sugar goals and A1c goals for home.    RNs to provide ongoing basic DM education at bedside with this patient.  Have ordered educational booklet, insulin starter kit, and DM videos.  Have also placed RD consult for DM diet education for this patient.  Discussed with pt that we will continue to have him self administer insulin injections for practice.  Also discussed with pt why we are restarting the IV Insulin drip and what the plan will be to get him off the drip.  Pt very agreeable to plan.    --Will follow patient during hospitalization--  Wyn Quaker RN, MSN, CDE Diabetes Coordinator Inpatient Glycemic Control Team Team Pager: [phone] (8a-5p)

## 2018-06-24 ENCOUNTER — Inpatient Hospital Stay: Payer: Self-pay | Admitting: Family Medicine

## 2018-06-24 LAB — GLUCOSE, CAPILLARY
GLUCOSE-CAPILLARY: 143 mg/dL — AB (ref 70–99)
GLUCOSE-CAPILLARY: 171 mg/dL — AB (ref 70–99)
Glucose-Capillary: 134 mg/dL — ABNORMAL HIGH (ref 70–99)
Glucose-Capillary: 136 mg/dL — ABNORMAL HIGH (ref 70–99)
Glucose-Capillary: 136 mg/dL — ABNORMAL HIGH (ref 70–99)
Glucose-Capillary: 138 mg/dL — ABNORMAL HIGH (ref 70–99)
Glucose-Capillary: 147 mg/dL — ABNORMAL HIGH (ref 70–99)
Glucose-Capillary: 151 mg/dL — ABNORMAL HIGH (ref 70–99)
Glucose-Capillary: 157 mg/dL — ABNORMAL HIGH (ref 70–99)
Glucose-Capillary: 161 mg/dL — ABNORMAL HIGH (ref 70–99)
Glucose-Capillary: 161 mg/dL — ABNORMAL HIGH (ref 70–99)
Glucose-Capillary: 161 mg/dL — ABNORMAL HIGH (ref 70–99)
Glucose-Capillary: 163 mg/dL — ABNORMAL HIGH (ref 70–99)
Glucose-Capillary: 165 mg/dL — ABNORMAL HIGH (ref 70–99)
Glucose-Capillary: 171 mg/dL — ABNORMAL HIGH (ref 70–99)
Glucose-Capillary: 171 mg/dL — ABNORMAL HIGH (ref 70–99)
Glucose-Capillary: 177 mg/dL — ABNORMAL HIGH (ref 70–99)
Glucose-Capillary: 178 mg/dL — ABNORMAL HIGH (ref 70–99)
Glucose-Capillary: 182 mg/dL — ABNORMAL HIGH (ref 70–99)
Glucose-Capillary: 186 mg/dL — ABNORMAL HIGH (ref 70–99)
Glucose-Capillary: 186 mg/dL — ABNORMAL HIGH (ref 70–99)
Glucose-Capillary: 189 mg/dL — ABNORMAL HIGH (ref 70–99)
Glucose-Capillary: 199 mg/dL — ABNORMAL HIGH (ref 70–99)
Glucose-Capillary: 220 mg/dL — ABNORMAL HIGH (ref 70–99)

## 2018-06-24 LAB — BASIC METABOLIC PANEL
Anion gap: 11 (ref 5–15)
Anion gap: 11 (ref 5–15)
Anion gap: 11 (ref 5–15)
Anion gap: 8 (ref 5–15)
BUN: 6 mg/dL (ref 6–20)
BUN: 6 mg/dL (ref 6–20)
BUN: 6 mg/dL (ref 6–20)
BUN: 6 mg/dL (ref 6–20)
CALCIUM: 9.8 mg/dL (ref 8.9–10.3)
CHLORIDE: 106 mmol/L (ref 98–111)
CO2: 14 mmol/L — ABNORMAL LOW (ref 22–32)
CO2: 15 mmol/L — ABNORMAL LOW (ref 22–32)
CO2: 15 mmol/L — ABNORMAL LOW (ref 22–32)
CO2: 19 mmol/L — ABNORMAL LOW (ref 22–32)
CREATININE: 0.99 mg/dL (ref 0.61–1.24)
Calcium: 9.3 mg/dL (ref 8.9–10.3)
Calcium: 9.8 mg/dL (ref 8.9–10.3)
Calcium: 9.5 mg/dL (ref 8.9–10.3)
Chloride: 108 mmol/L (ref 98–111)
Chloride: 106 mmol/L (ref 98–111)
Chloride: 106 mmol/L (ref 98–111)
Creatinine, Ser: 0.94 mg/dL (ref 0.61–1.24)
Creatinine, Ser: 0.9 mg/dL (ref 0.61–1.24)
Creatinine, Ser: 0.94 mg/dL (ref 0.61–1.24)
GFR calc Af Amer: >60 mL/min (ref >60)
GFR calc Af Amer: >60 mL/min (ref >60)
GFR calc Af Amer: >60 mL/min (ref >60)
GFR calc non Af Amer: >60 mL/min (ref >60)
GFR calc non Af Amer: >60 mL/min (ref >60)
GFR calc non Af Amer: >60 mL/min (ref >60)
GLUCOSE: 142 mg/dL — AB (ref 70–99)
Glucose, Bld: 156 mg/dL — ABNORMAL HIGH (ref 70–99)
Glucose, Bld: 170 mg/dL — ABNORMAL HIGH (ref 70–99)
Glucose, Bld: 196 mg/dL — ABNORMAL HIGH (ref 70–99)
Potassium: 3 mmol/L — ABNORMAL LOW (ref 3.5–5.1)
Potassium: 3.1 mmol/L — ABNORMAL LOW (ref 3.5–5.1)
Potassium: 2.7 mmol/L — CL (ref 3.5–5.1)
Potassium: 2.6 mmol/L — CL (ref 3.5–5.1)
Sodium: 133 mmol/L — ABNORMAL LOW (ref 135–145)
Sodium: 132 mmol/L — ABNORMAL LOW (ref 135–145)
Sodium: 132 mmol/L — ABNORMAL LOW (ref 135–145)
Sodium: 133 mmol/L — ABNORMAL LOW (ref 135–145)

## 2018-06-24 MED ORDER — POTASSIUM CHLORIDE 10 MEQ/100ML IV SOLN
10.0000 meq | INTRAVENOUS | Status: AC
  Administered 2018-06-24 (×2): 10 meq via INTRAVENOUS
  Filled 2018-06-24 (×2): qty 100

## 2018-06-24 MED ORDER — POTASSIUM CHLORIDE 10 MEQ/100ML IV SOLN
10.0000 meq | INTRAVENOUS | Status: DC
  Administered 2018-06-24: 10 meq via INTRAVENOUS
  Filled 2018-06-24: qty 100

## 2018-06-24 MED ORDER — POTASSIUM CHLORIDE CRYS ER 20 MEQ PO TBCR
40.0000 meq | EXTENDED_RELEASE_TABLET | ORAL | Status: DC
  Administered 2018-06-24: 40 meq via ORAL
  Filled 2018-06-24: qty 2

## 2018-06-24 MED ORDER — POTASSIUM CHLORIDE 10 MEQ/100ML IV SOLN: 10.0000 meq | INTRAVENOUS | Status: DC

## 2018-06-24 NOTE — Progress Notes (Addendum)
CRITICAL VALUE ALERT  Critical Value:  Potassium 2.7     Date & Time Notified:  1/8 1633  Provider Notified: Dr. Jerral Ralph  Orders Received/Actions taken: verbal orders received - administer 3 runs of IV potassium and recheck bmp after at 2100.

## 2018-06-24 NOTE — Progress Notes (Signed)
CRITICAL VALUE ALERT  Critical Value:  Potassium 2.6  Date & Time Notied:  06/24/2018 at 2345  Provider Notified: Hanley Hays  Orders Received/Actions taken: potassium chloride 40 meq oral every 4 hrs.

## 2018-06-24 NOTE — Progress Notes (Addendum)
Inpatient Diabetes Program Recommendations  AACE/ADA: New Consensus Statement on Inpatient Glycemic Control (2015)  Target Ranges:  Prepandial:   less than 140 mg/dL      Peak postprandial:   less than 180 mg/dL (1-2 hours)      Critically ill patients:  140 - 180 mg/dL   Lab Results  Component Value Date   GLUCAP 171 (H) 06/24/2018   HGBA1C 11.6 (H) 06/23/2018    Review of Glycemic Control Results for Louis, [name] (MRN [id number]) as of 06/24/2018 09:21  Ref. Range 06/24/2018 05:24 06/24/2018 06:26 06/24/2018 07:28 06/24/2018 08:35  Glucose-Capillary Latest Ref Range: 70 - 99 mg/dL 174 (H) 944 (H) 967 (H) 171 (H)   Admit with: DKA  History: DM (borderline Diabetes diagnosed in high school) Home DM Meds: Metformin (NOT taking) Current Orders: IV Insulin Drip (started yest at 3:30pm)    Note that 0330 am BMET today shows Anion Gap= 11 and CO2= 14.  Not ready to transition to SQ Insulin yet per DKA protocol parameters.  When appropriate and ready for transition, consider transitioning with Lantus 62 units 2 hours PRIOR to DC of insulin drip, then Q24 Hours following. Additionally, consider adding Novolog 6 units TID (assuming that patient is consuming >50% of meal) for meal coverage and Novolog 0-15 units TID & HS.    Addendum@1530 : Spoke with patient regarding DM diagnosis and to ensure patient's comfortability with injections.  Again reviewed patient's current A1c of 11.6%. Explained what a A1c is and what it measures. Also reviewed goal A1c with patient, importance of good glucose control @ home, and blood sugar goals. Reviewed patho of DM, need for additional insulin, role of pancreas, weight loss in diabetes, vascular changes and comorbidites.  Discussed diet, carb counting, goal setting, when to call MD, reviewed survival skills, and DKA. Patient reports completely eliminating sugar due to diet change for the New Year. Discussed patho of this process in diabetes and how it led  to acidosis. Patient requesting to meet with dietitian again to review, especially since he without insurance. Patient to follow up with [facility]. Will need a meter at discharge. ([id number]). He feels comfortable with self injection and plans to incorporate into lifestyle. Has no further questions at this time.    Thanks, Lujean Rave, MSN, RNC-OB Diabetes Coordinator [phone] (8a-5p)

## 2018-06-24 NOTE — Progress Notes (Signed)
PROGRESS NOTE        PATIENT DETAILS Name: Louis [name] Age: 28 y.o. Sex: male Date of Birth: 1990/09/11 Admit Date: 06/22/2018 Admitting Physician Carron CurieAli Hijazi, MD WUJ:[id number]:Avbuere, Dorma RussellEdwin, MD  Brief Narrative: Patient is a 28 y.o. male with history of morbid obesity-presenting with polyuria, polydipsia, nausea-found to have DKA.  See below for further details  Subjective: Feels much better-no chest pain or shortness of breath  Assessment/Plan: DKA: Improving-anion gap is closed-but bicarb levels are still not appropriate.  Continue insulin drip, when bicarb levels are closer to 20-we will stop insulin infusion and transition to subcutaneous insulin.  Hypokalemia: Replete and recheck  Morbid obesity: Counseled-claims he has already lost quite a bit of weight-and was more than 500 pounds a few months back  GERD: Continue PPI  DVT Prophylaxis: Prophylactic Lovenox   Code Status: Full code   Family Communication: None at bedside  Disposition Plan: Remain inpatient  Antimicrobial agents: Anti-infectives (From admission, onward)   None      Procedures: None  CONSULTS:  None  Time spent: 25- minutes-Greater than 50% of this time was spent in counseling, explanation of diagnosis, planning of further management, and coordination of care.  MEDICATIONS: Scheduled Meds: . enoxaparin (LOVENOX) injection  100 mg Subcutaneous Q24H  . insulin regular  0-10 Units Intravenous TID WC  . loratadine  10 mg Oral Daily  . multivitamin with minerals  1 tablet Oral Daily  . potassium chloride  40 mEq Oral Q4H   Continuous Infusions: . sodium chloride Stopped (06/23/18 1325)  . sodium chloride    . dextrose 5 % and 0.45% NaCl 100 mL/hr at 06/24/18 0311  . insulin 8.1 Units/hr (06/24/18 1337)   PRN Meds:.acetaminophen, dextrose   PHYSICAL EXAM: Vital signs: Vitals:   06/23/18 2332 06/24/18 0336 06/24/18 0338 06/24/18 0731  BP: (!) 157/85  (!) 157/90  133/90  Pulse: (!) 116     Resp: (!) 25  16   Temp:  98.7 F (37.1 C) 98 F (36.7 C) 98.4 F (36.9 C)  TempSrc:  Oral Oral Oral  SpO2: 97%  99% 98%  Weight:      Height:       Filed Weights   06/23/18 0010  Weight: (!) 209.6 kg   Body mass index is 66.31 kg/m.   General appearance :Awake, alert, not in any distress.  HEENT: Atraumatic and Normocephalic Neck: supple Resp:Good air entry bilaterally, no added sounds  CVS: S1 S2 regular, no murmurs.  GI: Bowel sounds present, Non tender and not distended with no gaurding, rigidity or rebound.No organomegaly Extremities: B/L Lower Ext shows no edema, both legs are warm to touch Neurology:  speech clear,Non focal, sensation is grossly intact. Musculoskeletal:No digital cyanosis Skin:No Rash, warm and dry Wounds:N/A  I have personally reviewed following labs and imaging studies  LABORATORY DATA: CBC: Recent Labs  Lab 06/22/18 1120  WBC 16.6*  NEUTROABS 13.0*  HGB 16.5  HCT 50.3  MCV 91.3  PLT 254    Basic Metabolic Panel: Recent Labs  Lab 06/23/18 1218 06/23/18 1624 06/23/18 2218 06/24/18 0331 06/24/18 1012  NA 134* 135 133* 133* 132*  K 5.0 4.2 4.0 3.0* 3.1*  CL 112* 113* 108 108 106  CO2 7* 8* 10* 14* 15*  GLUCOSE 218* 193* 163* 156* 170*  BUN 7 9 7 6  6  CREATININE 1.05 1.10 0.96 0.94 0.99  CALCIUM 9.6 9.6 9.3 9.3 9.8    GFR: Estimated Creatinine Clearance: 198.7 mL/min (by C-G formula based on SCr of 0.99 mg/dL).  Liver Function Tests: Recent Labs  Lab 06/22/18 1120  AST 24  ALT 38  ALKPHOS 92  BILITOT 2.1*  PROT 9.1*  ALBUMIN 4.7   Recent Labs  Lab 06/22/18 1120  LIPASE 57*   No results for input(s): AMMONIA in the last 168 hours.  Coagulation Profile: No results for input(s): INR, PROTIME in the last 168 hours.  Cardiac Enzymes: No results for input(s): CKTOTAL, CKMB, CKMBINDEX, TROPONINI in the last 168 hours.  BNP (last 3 results) No results for input(s): PROBNP in the last  8760 hours.  HbA1C: Recent Labs    06/23/18 0432  HGBA1C 11.6*    CBG: Recent Labs  Lab 06/24/18 0930 06/24/18 1041 06/24/18 1138 06/24/18 1245 06/24/18 1336  GLUCAP 161* 171* 171* 178* 186*    Lipid Profile: No results for input(s): CHOL, HDL, LDLCALC, TRIG, CHOLHDL, LDLDIRECT in the last 72 hours.  Thyroid Function Tests: No results for input(s): TSH, T4TOTAL, FREET4, T3FREE, THYROIDAB in the last 72 hours.  Anemia Panel: No results for input(s): VITAMINB12, FOLATE, FERRITIN, TIBC, IRON, RETICCTPCT in the last 72 hours.  Urine analysis:    Component Value Date/Time   COLORURINE STRAW (A) 06/22/2018 1120   APPEARANCEUR CLEAR 06/22/2018 1120   LABSPEC 1.022 06/22/2018 1120   PHURINE 5.0 06/22/2018 1120   GLUCOSEU >=500 (A) 06/22/2018 1120   HGBUR MODERATE (A) 06/22/2018 1120   BILIRUBINUR NEGATIVE 06/22/2018 1120   KETONESUR 80 (A) 06/22/2018 1120   PROTEINUR 100 (A) 06/22/2018 1120   NITRITE NEGATIVE 06/22/2018 1120   LEUKOCYTESUR NEGATIVE 06/22/2018 1120    Sepsis Labs: Lactic Acid, Venous    Component Value Date/Time   LATICACIDVEN 2.88 (HH) 06/22/2018 1256    MICROBIOLOGY: Recent Results (from the past 240 hour(s))  MRSA PCR Screening     Status: None   Collection Time: 06/22/18 11:55 PM  Result Value Ref Range Status   MRSA by PCR NEGATIVE NEGATIVE Final    Comment:        The GeneXpert MRSA Assay (FDA approved for NASAL specimens only), is one component of a comprehensive MRSA colonization surveillance program. It is not intended to diagnose MRSA infection nor to guide or monitor treatment for MRSA infections. Performed at [facility], [address] [place], [place] [address]     RADIOLOGY STUDIES/RESULTS: Dg Chest 2 View  Result Date: 06/22/2018 CLINICAL DATA:  Midline chest pain, nausea/vomiting EXAM: CHEST - 2 VIEW COMPARISON:  None. FINDINGS: Lungs are clear.  No pleural effusion or pneumothorax. The heart is normal in size.  Visualized osseous structures are within normal limits. IMPRESSION: Normal chest radiographs. Electronically Signed   By: Charline Bills M.D.   On: 06/22/2018 13:06     LOS: 2 days   Jeoffrey Massed, MD  [facility]  If 7PM-7AM, please contact night-coverage  Please page via [email or web address]-click on MD name and type text message  06/24/2018, 2:33 PM

## 2018-06-25 LAB — GLUCOSE, CAPILLARY
GLUCOSE-CAPILLARY: 237 mg/dL — AB (ref 70–99)
GLUCOSE-CAPILLARY: 261 mg/dL — AB (ref 70–99)
Glucose-Capillary: 140 mg/dL — ABNORMAL HIGH (ref 70–99)
Glucose-Capillary: 150 mg/dL — ABNORMAL HIGH (ref 70–99)
Glucose-Capillary: 157 mg/dL — ABNORMAL HIGH (ref 70–99)
Glucose-Capillary: 164 mg/dL — ABNORMAL HIGH (ref 70–99)
Glucose-Capillary: 164 mg/dL — ABNORMAL HIGH (ref 70–99)
Glucose-Capillary: 167 mg/dL — ABNORMAL HIGH (ref 70–99)
Glucose-Capillary: 168 mg/dL — ABNORMAL HIGH (ref 70–99)
Glucose-Capillary: 178 mg/dL — ABNORMAL HIGH (ref 70–99)
Glucose-Capillary: 179 mg/dL — ABNORMAL HIGH (ref 70–99)
Glucose-Capillary: 181 mg/dL — ABNORMAL HIGH (ref 70–99)

## 2018-06-25 LAB — BASIC METABOLIC PANEL
Anion gap: 7 (ref 5–15)
BUN: 6 mg/dL (ref 6–20)
CO2: 19 mmol/L — ABNORMAL LOW (ref 22–32)
Calcium: 9.2 mg/dL (ref 8.9–10.3)
Chloride: 108 mmol/L (ref 98–111)
Creatinine, Ser: 0.8 mg/dL (ref 0.61–1.24)
GFR calc Af Amer: >60 mL/min (ref >60)
GFR calc non Af Amer: >60 mL/min (ref >60)
Glucose, Bld: 173 mg/dL — ABNORMAL HIGH (ref 70–99)
Potassium: 3 mmol/L — ABNORMAL LOW (ref 3.5–5.1)
Sodium: 134 mmol/L — ABNORMAL LOW (ref 135–145)

## 2018-06-25 LAB — MAGNESIUM: Magnesium: 1.9 mg/dL (ref 1.7–2.4)

## 2018-06-25 MED ORDER — INSULIN ASPART 100 UNIT/ML ~~LOC~~ SOLN
0.0000 [IU] | Freq: Every day | SUBCUTANEOUS | Status: DC
  Administered 2018-06-25: 3 [IU] via SUBCUTANEOUS

## 2018-06-25 MED ORDER — POTASSIUM CHLORIDE 20 MEQ/15ML (10%) PO SOLN
40.0000 meq | ORAL | Status: AC
  Administered 2018-06-25 (×2): 40 meq via ORAL
  Filled 2018-06-25 (×2): qty 30

## 2018-06-25 MED ORDER — INSULIN GLARGINE 100 UNIT/ML ~~LOC~~ SOLN
40.0000 [IU] | Freq: Every day | SUBCUTANEOUS | Status: DC
  Administered 2018-06-25: 40 [IU] via SUBCUTANEOUS
  Filled 2018-06-25 (×2): qty 0.4

## 2018-06-25 MED ORDER — POTASSIUM CHLORIDE CRYS ER 20 MEQ PO TBCR
40.0000 meq | EXTENDED_RELEASE_TABLET | ORAL | Status: AC
  Administered 2018-06-25 (×2): 40 meq via ORAL
  Filled 2018-06-25 (×2): qty 2

## 2018-06-25 MED ORDER — INSULIN ASPART 100 UNIT/ML ~~LOC~~ SOLN
0.0000 [IU] | Freq: Three times a day (TID) | SUBCUTANEOUS | Status: DC
  Administered 2018-06-25: 5 [IU] via SUBCUTANEOUS
  Administered 2018-06-25 (×2): 3 [IU] via SUBCUTANEOUS
  Administered 2018-06-26: 8 [IU] via SUBCUTANEOUS

## 2018-06-25 MED ORDER — POTASSIUM CHLORIDE CRYS ER 20 MEQ PO TBCR: 40.0000 meq | EXTENDED_RELEASE_TABLET | ORAL | Status: DC

## 2018-06-25 MED ORDER — INSULIN ASPART 100 UNIT/ML ~~LOC~~ SOLN
6.0000 [IU] | Freq: Three times a day (TID) | SUBCUTANEOUS | Status: DC
  Administered 2018-06-25 (×3): 6 [IU] via SUBCUTANEOUS

## 2018-06-25 NOTE — Progress Notes (Signed)
PROGRESS NOTE        PATIENT DETAILS Name: Louis [name] Age: 28 y.o. Sex: male Date of Birth: [birth year] Admit Date: 06/22/2018 Admitting Physician Carron Curie, MD EGB:[id number], Dorma Russell, MD  Brief Narrative: Patient is a 28 y.o. male with history of morbid obesity-presenting with polyuria, polydipsia, nausea-found to have DKA.  See below for further details  Subjective: No nausea or vomiting-dyspeptic symptoms have resolved.  Assessment/Plan: DKA: Resolved-off insulin infusion-and transitioned to subcutaneous insulin.    Newly diagnosed DM-2 with uncontrolled hyperglycemia: Since DKA has resolved-start Lantus 40 units daily, 6 units of NovoLog with meals and SSI.  A1c of 11.6.  Will follow and adjust accordingly  Hypokalemia: Continue to replete.  Morbid obesity: Counseled-claims he has already lost quite a bit of weight-and was more than 500 pounds a few months back  GERD: Continue PPI  DVT Prophylaxis: Prophylactic Lovenox   Code Status: Full code   Family Communication: None at bedside  Disposition Plan: Remain inpatient  Antimicrobial agents: Anti-infectives (From admission, onward)   None      Procedures: None  CONSULTS:  None  Time spent: 25- minutes-Greater than 50% of this time was spent in counseling, explanation of diagnosis, planning of further management, and coordination of care.  MEDICATIONS: Scheduled Meds: . enoxaparin (LOVENOX) injection  100 mg Subcutaneous Q24H  . insulin aspart  0-15 Units Subcutaneous TID WC  . insulin aspart  0-5 Units Subcutaneous QHS  . insulin aspart  6 Units Subcutaneous TID WC  . insulin glargine  40 Units Subcutaneous Daily  . loratadine  10 mg Oral Daily  . multivitamin with minerals  1 tablet Oral Daily  . potassium chloride  40 mEq Oral Q4H   Continuous Infusions:  PRN Meds:.acetaminophen, dextrose   PHYSICAL EXAM: Vital signs: Vitals:   06/24/18 2305 06/25/18 0420 06/25/18  0435 06/25/18 0633  BP: 137/81  (!) 152/107 135/90  Pulse: (!) 121     Resp: (!) 21  (!) 31 17  Temp: 98.5 F (36.9 C) 98.5 F (36.9 C)    TempSrc: Oral Oral    SpO2:      Weight:      Height:       Filed Weights   06/23/18 0010  Weight: (!) 209.6 kg   Body mass index is 66.31 kg/m.   General appearance:Awake, alert, not in any distress.  Eyes:no scleral icterus. HEENT: Atraumatic and Normocephalic Neck: supple, no JVD. Resp:Good air entry bilaterally,no rales or rhonchi CVS: S1 S2 regular, no murmurs.  GI: Bowel sounds present, Non tender and not distended with no gaurding, rigidity or rebound. Extremities: B/L Lower Ext shows no edema, both legs are warm to touch Neurology:  Non focal Psychiatric: Normal judgment and insight. Normal mood. Musculoskeletal:No digital cyanosis Skin:No Rash, warm and dry Wounds:N/A  I have personally reviewed following labs and imaging studies  LABORATORY DATA: CBC: Recent Labs  Lab 06/22/18 1120  WBC 16.6*  NEUTROABS 13.0*  HGB 16.5  HCT 50.3  MCV 91.3  PLT 254    Basic Metabolic Panel: Recent Labs  Lab 06/24/18 0331 06/24/18 1012 06/24/18 1524 06/24/18 2255 06/25/18 0722  NA 133* 132* 132* 133* 134*  K 3.0* 3.1* 2.7* 2.6* 3.0*  CL 108 106 106 106 108  CO2 14* 15* 15* 19* 19*  GLUCOSE 156* 170* 196* 142* 173*  BUN  6 6 6 6 6   CREATININE 0.94 0.99 0.90 0.94 0.80  CALCIUM 9.3 9.8 9.8 9.5 9.2  MG  --   --   --   --  1.9    GFR: Estimated Creatinine Clearance: 245.9 mL/min (by C-G formula based on SCr of 0.8 mg/dL).  Liver Function Tests: Recent Labs  Lab 06/22/18 1120  AST 24  ALT 38  ALKPHOS 92  BILITOT 2.1*  PROT 9.1*  ALBUMIN 4.7   Recent Labs  Lab 06/22/18 1120  LIPASE 57*   No results for input(s): AMMONIA in the last 168 hours.  Coagulation Profile: No results for input(s): INR, PROTIME in the last 168 hours.  Cardiac Enzymes: No results for input(s): CKTOTAL, CKMB, CKMBINDEX, TROPONINI in  the last 168 hours.  BNP (last 3 results) No results for input(s): PROBNP in the last 8760 hours.  HbA1C: Recent Labs    06/23/18 0432  HGBA1C 11.6*    CBG: Recent Labs  Lab 06/25/18 0421 06/25/18 0526 06/25/18 0627 06/25/18 0741 06/25/18 0955  GLUCAP 179* 168* 164* 164* 181*    Lipid Profile: No results for input(s): CHOL, HDL, LDLCALC, TRIG, CHOLHDL, LDLDIRECT in the last 72 hours.  Thyroid Function Tests: No results for input(s): TSH, T4TOTAL, FREET4, T3FREE, THYROIDAB in the last 72 hours.  Anemia Panel: No results for input(s): VITAMINB12, FOLATE, FERRITIN, TIBC, IRON, RETICCTPCT in the last 72 hours.  Urine analysis:    Component Value Date/Time   COLORURINE STRAW (A) 06/22/2018 1120   APPEARANCEUR CLEAR 06/22/2018 1120   LABSPEC 1.022 06/22/2018 1120   PHURINE 5.0 06/22/2018 1120   GLUCOSEU >=500 (A) 06/22/2018 1120   HGBUR MODERATE (A) 06/22/2018 1120   BILIRUBINUR NEGATIVE 06/22/2018 1120   KETONESUR 80 (A) 06/22/2018 1120   PROTEINUR 100 (A) 06/22/2018 1120   NITRITE NEGATIVE 06/22/2018 1120   LEUKOCYTESUR NEGATIVE 06/22/2018 1120    Sepsis Labs: Lactic Acid, Venous    Component Value Date/Time   LATICACIDVEN 2.88 (HH) 06/22/2018 1256    MICROBIOLOGY: Recent Results (from the past 240 hour(s))  MRSA PCR Screening     Status: None   Collection Time: 06/22/18 11:55 PM  Result Value Ref Range Status   MRSA by PCR NEGATIVE NEGATIVE Final    Comment:        The GeneXpert MRSA Assay (FDA approved for NASAL specimens only), is one component of a comprehensive MRSA colonization surveillance program. It is not intended to diagnose MRSA infection nor to guide or monitor treatment for MRSA infections. Performed at [facility], [address] [place], [place] [address]     RADIOLOGY STUDIES/RESULTS: Dg Chest 2 View  Result Date: 06/22/2018 CLINICAL DATA:  Midline chest pain, nausea/vomiting EXAM: CHEST - 2 VIEW COMPARISON:  None.  FINDINGS: Lungs are clear.  No pleural effusion or pneumothorax. The heart is normal in size. Visualized osseous structures are within normal limits. IMPRESSION: Normal chest radiographs. Electronically Signed   By: Charline Bills M.D.   On: 06/22/2018 13:06     LOS: 3 days   Jeoffrey Massed, MD  [facility]  If 7PM-7AM, please contact night-coverage  Please page via [email or web address]-click on MD name and type text message  06/25/2018, 10:25 AM

## 2018-06-25 NOTE — Progress Notes (Signed)
Inpatient Diabetes Program Recommendations  AACE/ADA: New Consensus Statement on Inpatient Glycemic Control (2015)  Target Ranges:  Prepandial:   less than 140 mg/dL      Peak postprandial:   less than 180 mg/dL (1-2 hours)      Critically ill patients:  140 - 180 mg/dL   Lab Results  Component Value Date   GLUCAP 178 (H) 06/25/2018   HGBA1C 11.6 (H) 06/23/2018    Review of Glycemic Control Results for Louis [name], Louis [name] (MRN [id number]) as of 06/25/2018 15:04  Ref. Range 06/25/2018 09:55 06/25/2018 10:44 06/25/2018 12:13  Glucose-Capillary Latest Ref Range: 70 - 99 mg/dL 361 (H) 443 (H) 154 (H)   Admit with:DKA  History:DM (borderline Diabetes diagnosed in high school) Home DM Meds:Metformin (NOT taking) Current Orders:Lantus 40 units QD, Novolog 6 units TID, Novolog 0-15 units TID, Novolog 0-5 units QHS  RD contact coordinator regarding additional questions related to new diagnosis. Patient independently administering insulin today and able to verbalize important points.  Confusion regarding dosages and what to do for hypoglycemia. Reviewed at length interventions associated with hypoglycemia. Patient able to verbalize following discussion. Education provided on differences between long acting and short acting insulin. Discussed current insulin needs and current dosages, however, explained that discharge orders may change based on inpatient needs.  Provided example of a daily insulin schedule, when to check CBGs, when to call MD, and times for injections.  Patient to follow up with [facility]. Will need a meter at discharge. ([id number]). He feels comfortable with self injection and plans to incorporate into lifestyle. Has no further questions at this time.   Thanks, Lujean Rave, MSN, RNC-OB Diabetes Coordinator [phone] (8a-5p)

## 2018-06-25 NOTE — Progress Notes (Signed)
Brief Nutrition Education Note  RD re-consulted regarding pt having additional questions about nutrition recommendations for diabetes.  Spoke with pt at bedside. Reviewed handout provided on 1/7. Discussed sample meal options that meet pt's carbohydrate allotments at mealtimes.  Pt states that he thought he was supposed to eat 10 grams of carbohydrates at meals for a total of 30 grams of carbohydrates daily. Discussed RD recommendations for 45-60 grams of carbohydrates daily at meals and 15-30 grams of carbohydrates at snacks.  Discussed pt's estimated kcal needs and % of kcal needs that come from carbohydrates. Pt states that he wants to cut out all sugar. RD reiterated that this is not the recommendation for diabetes management. Pt states he still would like to cut out sugar-sweetened beverages like tea, soda, and juice.  Pt reports having additional questions regarding where to go to get medications after discharge and what type of insulin he is going to be taking ("am I taking long-acting or short acting-insulin?"). RD relayed this information to Diabetes Coordinator per pt request.  Current diet order is Carb Modified. No meal completion charted at this time.  Labs and medications reviewed. No further nutrition interventions warranted at this time. If additional nutrition issues arise, please re-consult RD.    Earma Reading, MS, RD, LDN Inpatient Clinical Dietitian Pager: [phone] Weekend/After Hours: [phone]

## 2018-06-26 LAB — GLUCOSE, CAPILLARY
GLUCOSE-CAPILLARY: 278 mg/dL — AB (ref 70–99)
GLUCOSE-CAPILLARY: 297 mg/dL — AB (ref 70–99)

## 2018-06-26 MED ORDER — INSULIN ASPART 100 UNIT/ML FLEXPEN: 8.0000 [IU] | PEN_INJECTOR | Freq: Three times a day (TID) | SUBCUTANEOUS | 0 refills | Status: DC

## 2018-06-26 MED ORDER — FREESTYLE LANCETS MISC: 0 refills | Status: DC

## 2018-06-26 MED ORDER — LOSARTAN POTASSIUM 50 MG PO TABS
25.0000 mg | ORAL_TABLET | Freq: Every day | ORAL | Status: DC
  Administered 2018-06-26: 25 mg via ORAL
  Filled 2018-06-26: qty 1

## 2018-06-26 MED ORDER — METFORMIN HCL 500 MG PO TABS: 500.0000 mg | ORAL_TABLET | Freq: Two times a day (BID) | ORAL | 0 refills | Status: DC

## 2018-06-26 MED ORDER — METFORMIN HCL 500 MG PO TABS
500.0000 mg | ORAL_TABLET | Freq: Two times a day (BID) | ORAL | Status: DC
  Administered 2018-06-26: 500 mg via ORAL
  Filled 2018-06-26: qty 1

## 2018-06-26 MED ORDER — INSULIN GLARGINE 100 UNIT/ML ~~LOC~~ SOLN
45.0000 [IU] | Freq: Every day | SUBCUTANEOUS | Status: DC
  Administered 2018-06-26: 45 [IU] via SUBCUTANEOUS
  Filled 2018-06-26: qty 0.45

## 2018-06-26 MED ORDER — GLUCOSE BLOOD VI STRP: ORAL_STRIP | 0 refills | Status: DC

## 2018-06-26 MED ORDER — INSULIN PEN NEEDLE 32G X 8 MM MISC: 0 refills | Status: DC

## 2018-06-26 MED ORDER — BLOOD GLUCOSE METER KIT: PACK | 0 refills | Status: DC

## 2018-06-26 MED ORDER — INSULIN GLARGINE 100 UNIT/ML SOLOSTAR PEN: 45.0000 [IU] | PEN_INJECTOR | Freq: Every day | SUBCUTANEOUS | 0 refills | Status: DC

## 2018-06-26 MED ORDER — LOSARTAN POTASSIUM 25 MG PO TABS: 25.0000 mg | ORAL_TABLET | Freq: Every day | ORAL | 0 refills | Status: DC

## 2018-06-26 MED FILL — LOSARTAN POTASSIUM 25 MG TA: 25 | 30 days supply | Qty: 30 | Fill #0

## 2018-06-26 MED FILL — TRUE METRIX TEST STRIP: 25 days supply | Qty: 100 | Fill #0

## 2018-06-26 MED FILL — TRUEplus LANCETS 28G MISC: 25 days supply | Qty: 100 | Fill #0

## 2018-06-26 MED FILL — NOVOLOG FLEXPEN SYRINGE: 100 | 28 days supply | Qty: 15 | Fill #0

## 2018-06-26 MED FILL — !TRUE METRIX BLOOD GLUCOSE: 365 days supply | Qty: 1 | Fill #0

## 2018-06-26 MED FILL — LANTUS SOLOSTAR 100 UNITS/M: 100 | 21 days supply | Qty: 15 | Fill #0

## 2018-06-26 MED FILL — PENTIPS 31G X 8 MM MISC: 31G X 8 MM | 30 days supply | Qty: 100 | Fill #0

## 2018-06-26 MED FILL — metFORMIN HCL 500 MG TABS: 500 | 30 days supply | Qty: 60 | Fill #0

## 2018-06-26 NOTE — Progress Notes (Signed)
Pt given discharge instructions, prescriptions, and care notes. Pt verbalized understanding AEB no further questions or concerns at this time. IV was discontinued, no redness, pain, or swelling noted at this time. Pt left the floor in stable condition. 

## 2018-06-26 NOTE — Progress Notes (Signed)
Inpatient Diabetes Program Recommendations  AACE/ADA: New Consensus Statement on Inpatient Glycemic Control (2015)  Target Ranges:  Prepandial:   less than 140 mg/dL      Peak postprandial:   less than 180 mg/dL (1-2 hours)      Critically ill patients:  140 - 180 mg/dL   Lab Results  Component Value Date   GLUCAP 278 (H) 06/26/2018   HGBA1C 11.6 (H) 06/23/2018    Review of Glycemic Control Results for DEMICO, PLOCH (MRN [id number]) as of 06/26/2018 10:59  Ref. Range 06/25/2018 16:59 06/25/2018 20:45 06/26/2018 08:40  Glucose-Capillary Latest Ref Range: 70 - 99 mg/dL 237 (H) 261 (H) 278 (H)   History:DM (borderline Diabetes diagnosed in high school) Home DM Meds:Metformin (NOT taking) Current Orders:Lantus 45 units QD, Novolog 8 units TID, Novolog 0-15 units TID, Novolog 0-5 units QHS  Inpatient Diabetes Program Recommendations:    Reviewed discharge medications with patient and provide patient with insulin schedule at time of discharge. Reviewed when to check CBGs, survival skills, and when to call MD. Patient able to verbalize interventions. Explained to patient that orders were placed for meter and that he would have to go to [facility] to obtain. Expresses understanding.  Educated patient on insulin pen use at home. Reviewed contents of insulin flexpen starter kit. Reviewed all steps if insulin pen including attachment of needle, 2-unit air shot, dialing up dose, giving injection, removing needle, disposal of sharps, storage of unused insulin, disposal of insulin etc. Patient able to provide successful return demonstration. Also reviewed troubleshooting with insulin pen. MD to give patient Rxs for insulin pens and insulin pen needles.  Patient has no further questions at this time.   Thanks, Bronson Curb, MSN, RNC-OB Diabetes Coordinator [phone] (8a-5p)

## 2018-06-26 NOTE — Discharge Summary (Signed)
PATIENT DETAILS Name: Louis [name] Age: 28 y.o. Sex: male Date of Birth: 08-01-1990 MRN: [id number]. Admitting Physician: Merton Border, MD CNO:[id number], Christean Grief, MD  Admit Date: 06/22/2018 Discharge date: 06/26/2018  Recommendations for Outpatient Follow-up:  1. Follow up with PCP in 1-2 weeks 2. Please obtain BMP/CBC in one week 3. Please continue to optimize insulin regimen 4. Repeat A1c in the next 3 to 6 months 5. Continue counseling regarding importance of weight loss  Admitted From:  Home   Disposition: [place]: No  Equipment/Devices: None  Discharge Condition: Stable  CODE STATUS: FULL CODE  Diet recommendation:  Heart Healthy / Carb Modified   Brief Summary: See H&P, Labs, Consult and Test reports for all details in brief, Patient is a 28 y.o. male with history of morbid obesity-presenting with polyuria, polydipsia, nausea-found to have DKA.  See below for further details  Brief Hospital Course: DKA: Resolved-off insulin infusion-and transitioned to subcutaneous insulin.    Newly diagnosed DM-2 with uncontrolled hyperglycemia:  Continues to have mild hyperglycemia in spite of being on 40 units of Lantus-we will increase Lantus to 45 units daily on discharge, also increase NovoLog to 8 units with meals.  We will go and place him on metformin 500 mg twice daily.  A1c is 11.6.  Extensive counseling regarding importance of long-term glycemic control done with this patient by this MD and the diabetic coordinator.  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  We discussed symptoms of hypoglycemia and the measures to take if hypoglycemia occurred.  Case management has evaluated the patient-he will be provided a match letter-prescriptions have been sent over to the [facility].  Follow-up arrangement at the [facility] center has been arranged.   Hypokalemia:  Repleted-recheck at next visit.  Morbid obesity: Counseled-claims he has already lost quite a bit of weight-and was more than 500 pounds a few months back  Procedures/Studies: None  Discharge Diagnoses:  Active Problems:   Diabetic ketoacidosis [facility])   Discharge Instructions:  Activity:  As tolerated  Discharge Instructions    Diet - low sodium heart healthy   Complete by:  As directed    Diet Carb Modified   Complete by:  As directed    Discharge instructions   Complete by:  As directed    Follow with Primary MD in 1 week  Check sugars/CBG's 3-4 times a day-before meals. Keep a record of these readings, and take it to your next appointment with a PCP  Please get a complete blood count and chemistry panel checked by your Primary MD at your next visit, and again as instructed by your Primary MD.  Get Medicines reviewed and adjusted: Please take all your medications with you for your next visit with your Primary MD  Laboratory/radiological data: Please request your Primary MD to go over all hospital tests and procedure/radiological results at the follow up, please ask your Primary MD to get all Hospital records sent to his/her office.  In some cases, they will be blood work, cultures and biopsy results pending at the time of your discharge. Please request that your primary care M.D. follows up on these results.  Also Note the following: If you experience worsening of your admission symptoms, develop shortness of breath, life threatening emergency, suicidal or homicidal thoughts you must seek medical attention immediately by calling 911 or calling your MD immediately  if symptoms  less severe.  You must read complete instructions/literature along with all the possible adverse reactions/side effects for all the Medicines you take and that have been prescribed to you. Take any new Medicines after you have completely understood and accpet all the possible adverse reactions/side  effects.   Do not drive when taking Pain medications or sleeping medications (Benzodaizepines)  Do not take more than prescribed Pain, Sleep and Anxiety Medications. It is not advisable to combine anxiety,sleep and pain medications without talking with your primary care practitioner  Special Instructions: If you have smoked or chewed Tobacco  in the last 2 yrs please stop smoking, stop any regular Alcohol  and or any Recreational drug use.  Wear Seat belts while driving.  Please note: You were cared for by a hospitalist during your hospital stay. Once you are discharged, your primary care physician will handle any further medical issues. Please note that NO REFILLS for any discharge medications will be authorized once you are discharged, as it is imperative that you return to your primary care physician (or establish a relationship with a primary care physician if you do not have one) for your post hospital discharge needs so that they can reassess your need for medications and monitor your lab values.   Increase activity slowly   Complete by:  As directed      Allergies as of 06/26/2018      Reactions   Latex Rash      Medication List    STOP taking these medications   acetaminophen 325 MG tablet Commonly known as:  TYLENOL   HYDROcodone-acetaminophen 5-325 MG tablet Commonly known as:  NORCO/VICODIN     TAKE these medications   blood glucose meter kit and supplies Dispense based on patient and insurance preference. Use up to four times daily as directed. (FOR ICD-10 E10.9, E11.9).   freestyle lancets Use as instructed   glucose blood test strip Commonly known as:  FREESTYLE TEST STRIPS Use as instructed   insulin aspart 100 UNIT/ML FlexPen Commonly known as:  NOVOLOG Inject 8 Units into the skin 3 (three) times daily with meals.   Insulin Glargine 100 UNIT/ML Solostar Pen Commonly known as:  LANTUS Inject 45 Units into the skin daily at 10 pm.   Insulin Pen Needle  32G X 8 MM Misc Use as directed   losartan 25 MG tablet Commonly known as:  COZAAR Take 1 tablet (25 mg total) by mouth daily.   metFORMIN 500 MG tablet Commonly known as:  GLUCOPHAGE Take 1 tablet (500 mg total) by mouth 2 (two) times daily with a meal.      Follow-up Information    [place]. Go on 07/09/2018.   Why:  3:10 pm , Dr. Antony Blackbird Contact information: [place] [id number] [phone]         Allergies  Allergen Reactions  . Latex Rash    Consultations:   None  Other Procedures/Studies: Dg Chest 2 View  Result Date: 06/22/2018 CLINICAL DATA:  Midline chest pain, nausea/vomiting EXAM: CHEST - 2 VIEW COMPARISON:  None. FINDINGS: Lungs are clear.  No pleural effusion or pneumothorax. The heart is normal in size. Visualized osseous structures are within normal limits. IMPRESSION: Normal chest radiographs. Electronically Signed   By: Julian Hy M.D.   On: 06/22/2018 13:06      TODAY-DAY OF DISCHARGE:  Subjective:   Louis [name] today has no headache,no chest abdominal pain,no new weakness  tingling or numbness, feels much better wants to go home today.   Objective:   Blood pressure (!) 141/76, pulse (!) 108, temperature 98 F (36.7 C), resp. rate 12, height '5\' 10"'  (1.778 m), weight (!) 209.6 kg, SpO2 100 %.  Intake/Output Summary (Last 24 hours) at 06/26/2018 0955 Last data filed at 06/26/2018 0554 Gross per 24 hour  Intake 936.04 ml  Output 950 ml  Net -13.96 ml   Filed Weights   06/23/18 0010  Weight: (!) 209.6 kg    Exam: Awake Alert, Oriented *3, No new F.N deficits, Normal affect [place].AT,PERRAL Supple Neck,No JVD, No cervical lymphadenopathy appriciated.  Symmetrical Chest wall movement, Good air movement bilaterally, CTAB RRR,No Gallops,Rubs or new Murmurs, No Parasternal Heave +ve B.Sounds, Abd Soft, Non tender, No organomegaly appriciated, No rebound -guarding  or rigidity. No Cyanosis, Clubbing or edema, No new Rash or bruise   PERTINENT RADIOLOGIC STUDIES: Dg Chest 2 View  Result Date: 06/22/2018 CLINICAL DATA:  Midline chest pain, nausea/vomiting EXAM: CHEST - 2 VIEW COMPARISON:  None. FINDINGS: Lungs are clear.  No pleural effusion or pneumothorax. The heart is normal in size. Visualized osseous structures are within normal limits. IMPRESSION: Normal chest radiographs. Electronically Signed   By: Julian Hy M.D.   On: 06/22/2018 13:06     PERTINENT LAB RESULTS: CBC: No results for input(s): WBC, HGB, HCT, PLT in the last 72 hours. CMET CMP     Component Value Date/Time   NA 134 (L) 06/25/2018 0722   K 3.0 (L) 06/25/2018 0722   CL 108 06/25/2018 0722   CO2 19 (L) 06/25/2018 0722   GLUCOSE 173 (H) 06/25/2018 0722   BUN 6 06/25/2018 0722   CREATININE 0.80 06/25/2018 0722   CALCIUM 9.2 06/25/2018 0722   PROT 9.1 (H) 06/22/2018 1120   ALBUMIN 4.7 06/22/2018 1120   AST 24 06/22/2018 1120   ALT 38 06/22/2018 1120   ALKPHOS 92 06/22/2018 1120   BILITOT 2.1 (H) 06/22/2018 1120   GFRNONAA >60 06/25/2018 0722   GFRAA >60 06/25/2018 0722    GFR Estimated Creatinine Clearance: 245.9 mL/min (by C-G formula based on SCr of 0.8 mg/dL). No results for input(s): LIPASE, AMYLASE in the last 72 hours. No results for input(s): CKTOTAL, CKMB, CKMBINDEX, TROPONINI in the last 72 hours. Invalid input(s): POCBNP No results for input(s): DDIMER in the last 72 hours. No results for input(s): HGBA1C in the last 72 hours. No results for input(s): CHOL, HDL, LDLCALC, TRIG, CHOLHDL, LDLDIRECT in the last 72 hours. No results for input(s): TSH, T4TOTAL, T3FREE, THYROIDAB in the last 72 hours.  Invalid input(s): FREET3 No results for input(s): VITAMINB12, FOLATE, FERRITIN, TIBC, IRON, RETICCTPCT in the last 72 hours. Coags: No results for input(s): INR in the last 72 hours.  Invalid input(s): PT Microbiology: Recent Results (from the past 240  hour(s))  MRSA PCR Screening     Status: None   Collection Time: 06/22/18 11:55 PM  Result Value Ref Range Status   MRSA by PCR NEGATIVE NEGATIVE Final    Comment:        The GeneXpert MRSA Assay (FDA approved for NASAL specimens only), is one component of a comprehensive MRSA colonization surveillance program. It is not intended to diagnose MRSA infection nor to guide or monitor treatment for MRSA infections. Performed at [facility], [place] [address], [place], [place] [address]     FURTHER DISCHARGE INSTRUCTIONS:  Get Medicines reviewed and adjusted: Please take all your medications with  you for your next visit with your Primary MD  Laboratory/radiological data: Please request your Primary MD to go over all hospital tests and procedure/radiological results at the follow up, please ask your Primary MD to get all Hospital records sent to his/her office.  In some cases, they will be blood work, cultures and biopsy results pending at the time of your discharge. Please request that your primary care M.D. goes through all the records of your hospital data and follows up on these results.  Also Note the following: If you experience worsening of your admission symptoms, develop shortness of breath, life threatening emergency, suicidal or homicidal thoughts you must seek medical attention immediately by calling 911 or calling your MD immediately  if symptoms less severe.  You must read complete instructions/literature along with all the possible adverse reactions/side effects for all the Medicines you take and that have been prescribed to you. Take any new Medicines after you have completely understood and accpet all the possible adverse reactions/side effects.   Do not drive when taking Pain medications or sleeping medications (Benzodaizepines)  Do not take more than prescribed Pain, Sleep and Anxiety Medications. It is not advisable to combine anxiety,sleep and pain medications  without talking with your primary care practitioner  Special Instructions: If you have smoked or chewed Tobacco  in the last 2 yrs please stop smoking, stop any regular Alcohol  and or any Recreational drug use.  Wear Seat belts while driving.  Please note: You were cared for by a hospitalist during your hospital stay. Once you are discharged, your primary care physician will handle any further medical issues. Please note that NO REFILLS for any discharge medications will be authorized once you are discharged, as it is imperative that you return to your primary care physician (or establish a relationship with a primary care physician if you do not have one) for your post hospital discharge needs so that they can reassess your need for medications and monitor your lab values.  Total Time spent coordinating discharge including counseling, education and face to face time equals 35 minutes.  SignedOren Binet 06/26/2018 9:55 AM

## 2018-07-06 ENCOUNTER — Telehealth: Payer: Self-pay | Admitting: Family Medicine

## 2018-07-06 NOTE — Telephone Encounter (Signed)
Attempt to speak to patient. Left message on voicemail.  Phone rings once and goes to voicemail. 2 attempts made.

## 2018-07-06 NOTE — Telephone Encounter (Signed)
Pt called concerned after taking blood sugar medications his blood sugar dropped to 68. Pt states urine is very dark and he is concerned of UTI as well.  Appt HFU Thursday 07/09/18 with [facility].  Please advise. Pt phone [phone].

## 2018-07-09 ENCOUNTER — Ambulatory Visit: Payer: Self-pay | Attending: Family Medicine | Admitting: Family Medicine

## 2018-07-09 VITALS — BP 162/122 | HR 130 | Temp 99.5°F | Resp 18 | Ht 70.0 in | Wt >= 6400 oz

## 2018-07-09 DIAGNOSIS — Z87891 Personal history of nicotine dependence: Secondary | ICD-10-CM | POA: Insufficient documentation

## 2018-07-09 DIAGNOSIS — Z794 Long term (current) use of insulin: Secondary | ICD-10-CM | POA: Insufficient documentation

## 2018-07-09 DIAGNOSIS — Z79899 Other long term (current) drug therapy: Secondary | ICD-10-CM | POA: Insufficient documentation

## 2018-07-09 DIAGNOSIS — N3001 Acute cystitis with hematuria: Secondary | ICD-10-CM

## 2018-07-09 DIAGNOSIS — E1110 Type 2 diabetes mellitus with ketoacidosis without coma: Secondary | ICD-10-CM

## 2018-07-09 DIAGNOSIS — E119 Type 2 diabetes mellitus without complications: Secondary | ICD-10-CM

## 2018-07-09 DIAGNOSIS — Z6841 Body Mass Index (BMI) 40.0 and over, adult: Secondary | ICD-10-CM

## 2018-07-09 DIAGNOSIS — I10 Essential (primary) hypertension: Secondary | ICD-10-CM

## 2018-07-09 DIAGNOSIS — Z833 Family history of diabetes mellitus: Secondary | ICD-10-CM | POA: Insufficient documentation

## 2018-07-09 DIAGNOSIS — Z09 Encounter for follow-up examination after completed treatment for conditions other than malignant neoplasm: Secondary | ICD-10-CM

## 2018-07-09 LAB — POCT URINALYSIS DIP (CLINITEK)
Glucose, UA: NEGATIVE mg/dL
Nitrite, UA: POSITIVE — AB
POC PROTEIN,UA: 30 — AB
Spec Grav, UA: 1.025
Urobilinogen, UA: 2 U/dL — AB
pH, UA: 5.5

## 2018-07-09 LAB — GLUCOSE, POCT (MANUAL RESULT ENTRY): POC Glucose: 108 mg/dL — AB (ref 70–99)

## 2018-07-09 MED ORDER — INSULIN GLARGINE 100 UNIT/ML SOLOSTAR PEN: 42.0000 [IU] | PEN_INJECTOR | Freq: Every day | SUBCUTANEOUS | 6 refills | Status: DC

## 2018-07-09 MED ORDER — INSULIN ASPART 100 UNIT/ML FLEXPEN: 4.0000 [IU] | PEN_INJECTOR | Freq: Three times a day (TID) | SUBCUTANEOUS | 6 refills | Status: DC

## 2018-07-09 MED ORDER — SULFAMETHOXAZOLE-TRIMETHOPRIM 800-160 MG PO TABS: 1.0000 | ORAL_TABLET | Freq: Two times a day (BID) | ORAL | 0 refills | Status: AC

## 2018-07-09 MED ORDER — HYDROCHLOROTHIAZIDE 25 MG PO TABS: 25.0000 mg | ORAL_TABLET | Freq: Every day | ORAL | 1 refills | Status: DC

## 2018-07-09 MED ORDER — LOSARTAN POTASSIUM 50 MG PO TABS: 50.0000 mg | ORAL_TABLET | Freq: Every day | ORAL | 1 refills | Status: DC

## 2018-07-09 MED ORDER — METFORMIN HCL 500 MG PO TABS: 500.0000 mg | ORAL_TABLET | Freq: Two times a day (BID) | ORAL | 5 refills | Status: DC

## 2018-07-09 MED FILL — SULFAMETHOXAZOLE-TMP DS TAB: 800-160 | 7 days supply | Qty: 14 | Fill #0

## 2018-07-09 MED FILL — LOSARTAN POTASSIUM 50 MG TA: 50 | 30 days supply | Qty: 30 | Fill #0

## 2018-07-09 MED FILL — HYDROCHLOROTHIAZIDE 25 MG T: 25 | 30 days supply | Qty: 30 | Fill #0

## 2018-07-09 NOTE — Progress Notes (Signed)
Subjective:    Patient ID: Louis [name], male    DOB: 05/16/1991, 28 y.o.   MRN: [id number]  HPI       28 year old male new to the practice.  Patient is status post hospitalization on 06/22/2018 through 06/26/2018.  Patient presented to the emergency room due to her history of morbid obesity and complaints of polyuria polydipsia nausea and patient was found to have diabetic ketoacidosis.  Patient was discharged on Lantus 45 units and NovoLog 8 units with meals as well as metformin 500 mg twice daily.  Patient's hemoglobin A1c was 11.6 patient was also provided with counseling regarding diabetes.  Patient also had hypokalemia during hospitalization which was replaced.      Patient presents today for follow-up of diabetes.  Patient's random fingerstick blood sugar was 108.  Patient believes that the NovoLog 8 units pre-meal and 45 units of Lantus at nighttime may be too high for him.  Patient states that the day before his hospital discharge, his Lantus dose was increased from 40 to 45 units.  Patient states that he believes that this was done as the hospital team felt that he would likely not be eating properly status post hospital discharge.  Patient states that after taking the Lantus 1 night and not having a snack, patient woke up the next morning and his blood sugars were in the 60s and patient was very shaky and patient had trouble walking to the refrigerator to try and find some orange juice this patient lives alone.  Since that time, patient has been having a small snack before bedtime and his blood sugars have stayed in the low 100s, less than 120 fasting.      Patient reports that he is taking the blood pressure medication, losartan 25 mg which was prescribed at the hospital.  Patient thinks that his blood pressure may be elevated because of whitecoat hypertension as he gets nervous when he comes to the doctor's office.  Patient denies any headaches or dizziness related to his blood pressure.   Patient reports that he believes that he does snore slightly but states that he has lost some weight and his snoring has improved.  Patient states that prior to his hospitalization, he was having frequent urination, increased thirst, blurred vision and did not feel well in general.  Patient states that he has a twin brother who lives in [place] and patient was describing the symptoms to his brother who told him to go to the emergency department.  Patient states that his twin brother had onset of diabetes with DKA about 2 months ago.  Patient states that prior to being diagnosed with diabetes he was also having some unexplained weight loss.  Patient states that since his hospitalization he has been following a diet in which he has eliminated most complex carbohydrates such as breads, pastas and rice.  Patient is drinking only water or clear diet soda.      Patient reports that a lot of his weight gain started a few years ago after his mother died from cancer.  Patient believes that he was likely depressed as he was mostly staying at home and eating to help cope with his mother's death.  Patient states that he has come to the conclusion that he needs to start living again.  Patient has been watching his diet and making better choices.  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  Patient reports past medical history of requiring surgery on his left lower leg as a child in order to straighten out a bone in his lower leg.  Patient reports that his only allergy is a local skin reaction to latex.  Patient denies any other drug, food or insect allergies.  Patient quit smoking 2 weeks ago.  Patient reports family history of his father and twin brother with diabetes as well as a paternal family history of diabetes.      Patient reports that prior to his hospitalization he had urinary frequency and patient states that now he does not urinate quite as frequently  but he feels as if he does not put out as much urine at a time when he has the sudden urge to urinate.  Patient denies any fever or chills.  Patient states that he did state while in the hospital that he felt as if he had a urinary tract infection and patient states that this was never followed up on.  Patient states that now he does have a funny sensation at the end of urination in addition to frequency and urgency.  Patient would like to be checked to see if he has urinary tract infection.  Patient also states that since his hospitalization he has developed lower extremity edema which he states was not present prior to his hospitalization.         Review of Systems  Constitutional: Positive for fatigue. Negative for chills and fever.  HENT: Negative for sore throat and trouble swallowing.   Eyes: Positive for visual disturbance (Blurred vision which is improving as his blood sugars become more normal). Negative for photophobia.  Respiratory: Negative for cough and shortness of breath.   Cardiovascular: Positive for leg swelling. Negative for chest pain and palpitations.  Gastrointestinal: Negative for abdominal pain, constipation, diarrhea and nausea.  Endocrine: Negative for polydipsia and polyphagia.  Genitourinary: Positive for frequency. Negative for dysuria.  Musculoskeletal: Positive for back pain and gait problem.  Neurological: Negative for dizziness and headaches.  Hematological: Negative for adenopathy. Does not bruise/bleed easily.       Objective:   Physical Exam BP (!) 188/106 (BP Location: Left Arm, Patient Position: Sitting, Cuff Size: Large)   Pulse 92   Temp 99.5 F (37.5 C) (Oral)   Resp 18   Ht '5\' 10"'  (1.778 m)   Wt (!) 490 lb (222.3 kg)   SpO2 100%   BMI 70.31 kg/m Nurse's notes and vital signs reviewed General-morbidly obese young adult male in no acute distress ENT- TMs dull, nares with moderate edema of the nasal turbinates, patient with a slightly narrowed  posterior airway with mild posterior pharynx erythema. Neck-supple, no lymphadenopathy no carotid bruit Lungs-clear to auscultation bilaterally Cardiovascular-regular rate and rhythm, no murmur appreciated on exam Abdomen-truncal obesity, soft and nontender Back- patient with mild right CVA tenderness, mild lumbosacral discomfort to palpation and bilateral lumbar paraspinous spasm Extremities-patient does have bilateral distal lower extremity edema left greater than right as patient has some pitting that is 1+ to the mid shin on the left and trace pitting on the right.  Patient also with mild pedal edema. Foot exam-monofilament exam was not done at today's visit.  Patient does not have any active skin breakdown on the feet.  Patient with 1+ dorsalis pedis and posterior tibial pulses.  Patient with pedal edema.  Patient with some thickened skin on the soles of the feet.  Patient with mild thickening of the toenails and some discoloration  of the right great toenail.      Assessment & Plan:  1. Diabetic ketoacidosis without coma associated with type 2 diabetes mellitus ([place]) Patient is status post recent hospitalization for newly diagnosed diabetes/diabetic ketoacidosis.  Patient's hospital notes, and labs were reviewed.  Patient presented with glucose of 509 with sodium of 131 and serum creatinine of 1.67 on 06/22/2018.  Patient states that he has been taking his medication status post hospitalization and continues to follow a healthy diet low in complex carbohydrates.  Patient with glucose of 108 at today's visit. - Glucose (CBG)  2. Newly diagnosed diabetes ([place]) Patient is status post new diagnosis of diabetes.  Patient feels that the NovoLog that he is taking pre-meals may be too high of a dose.  Patient was asked to decrease the pre-meal NovoLog from current 8 units down to 4 units 3 times daily before meals.  Additionally, patient's Lantus has been decreased from 45 units down to 42 units and also  discussed with the patient that Lantus can be taken anytime of day and therefore if he is concerned about overnight drops in his blood sugar, patient may move his Lantus to daytime dosing.  Patient will have CMP, urinalysis at today's visit and patient is given refills of medications including NovoLog, Lantus and metformin.  Patient will return in 2 weeks in follow-up of hypertension and diabetes but was asked to call or return sooner if he is having any problems or concerns. - Comprehensive metabolic panel - POCT URINALYSIS DIP (CLINITEK) - insulin aspart (NOVOLOG) 100 UNIT/ML FlexPen; Inject 4 Units into the skin 3 (three) times daily with meals.  Dispense: 15 mL; Refill: 6 - Insulin Glargine (LANTUS) 100 UNIT/ML Solostar Pen; Inject 42 Units into the skin daily at 10 pm.  Dispense: 20 mL; Refill: 6 - metFORMIN (GLUCOPHAGE) 500 MG tablet; Take 1 tablet (500 mg total) by mouth 2 (two) times daily with a meal.  Dispense: 60 tablet; Refill: 5  3. Acute cystitis with hematuria Patient had abnormal urinalysis at today's visit consistent with urinary tract infection/cystitis.  Patient had reported urinary frequency, urgency and dysuria/funny sensation at the end of urination.  Patient's urine will be sent for culture and he will be notified if the changes needed in the antibiotic based on the culture results.  Patient will also have CBC at today's visit to look for elevated white blood cell count consistent with infection.  Patient is being placed on Septra DS twice daily x7 days and patient is encouraged to remain well-hydrated with water - Urine Culture - CBC with Differential - sulfamethoxazole-trimethoprim (BACTRIM DS,SEPTRA DS) 800-160 MG tablet; Take 1 tablet by mouth 2 (two) times daily for 7 days.  Dispense: 14 tablet; Refill: 0  4. Essential hypertension Patient's blood pressure was very elevated at today's visit.  Patient believes that he has some whitecoat hypertension and therefore is resistant  to new/additional medications.  Patient however will have increase in his losartan from 25 mg to 50 mg.  We will also start hydrochlorothiazide 25 mg once daily as this should help with blood pressure as well as patient's peripheral edema.  Patient will have CMP done in follow-up of electrolytes and if patient with continued elevated creatinine, patient's hydrochlorothiazide may have to be decreased or eliminated at follow-up - losartan (COZAAR) 50 MG tablet; Take 1 tablet (50 mg total) by mouth daily. To lower blood pressure  Dispense: 30 tablet; Refill: 1 - hydrochlorothiazide (HYDRODIURIL) 25 MG tablet; Take 1 tablet (  25 mg total) by mouth daily. Too lower blood pressure and decrease swelling  Dispense: 30 tablet; Refill: 1  5. Hospital discharge follow-up Patient is seen in follow-up of recent hospital discharge status post new diagnosis of diabetes which currently is insulin requiring.  Hospital history and physical/admission as well as discharge summary and labs were reviewed.  6. Morbid obesity with BMI of 70 and over, adult [facility]) Patient with morbid obesity with a BMI of 70.  Patient reports that he has made changes to his diet and has been losing weight.  Patient is encouraged to continue his dietary changes with goal of weight loss and incorporate low impact exercise.  At his follow-up visit, we will also discuss referral to a nutritionist versus bariatric weight loss surgeon.  Patient is encouraged to schedule an appointment for financial counseling at this office to see if he is eligible for any assistance with medications or follow-up medical care.  An After Visit Summary was printed and given to the patient.  Allergies as of 07/09/2018      Reactions   Latex Rash      Medication List       Accurate as of July 09, 2018 11:59 PM. Always use your most recent med list.        blood glucose meter kit and supplies Dispense based on patient and insurance preference. Use up to four  times daily as directed. (FOR ICD-10 E10.9, E11.9).   freestyle lancets Use as instructed   glucose blood test strip Commonly known as:  FREESTYLE TEST STRIPS Use as instructed   hydrochlorothiazide 25 MG tablet Commonly known as:  HYDRODIURIL Take 1 tablet (25 mg total) by mouth daily. Too lower blood pressure and decrease swelling   insulin aspart 100 UNIT/ML FlexPen Commonly known as:  NOVOLOG Inject 4 Units into the skin 3 (three) times daily with meals.   Insulin Glargine 100 UNIT/ML Solostar Pen Commonly known as:  LANTUS Inject 42 Units into the skin daily at 10 pm.   Insulin Pen Needle 32G X 8 MM Misc Use as directed   losartan 50 MG tablet Commonly known as:  COZAAR Take 1 tablet (50 mg total) by mouth daily. To lower blood pressure   metFORMIN 500 MG tablet Commonly known as:  GLUCOPHAGE Take 1 tablet (500 mg total) by mouth 2 (two) times daily with a meal.   sulfamethoxazole-trimethoprim 800-160 MG tablet Commonly known as:  BACTRIM DS,SEPTRA DS Take 1 tablet by mouth 2 (two) times daily for 7 days.       Return in about 2 weeks (around 07/23/2018) for DM.

## 2018-07-10 ENCOUNTER — Encounter: Payer: Self-pay | Admitting: Family Medicine

## 2018-07-10 LAB — COMPREHENSIVE METABOLIC PANEL WITH GFR
ALT: 50 IU/L — ABNORMAL HIGH (ref 0–44)
AST: 43 IU/L — ABNORMAL HIGH (ref 0–40)
Albumin/Globulin Ratio: 1.2 (ref 1.2–2.2)
Albumin: 3.6 g/dL — ABNORMAL LOW (ref 4.1–5.2)
Alkaline Phosphatase: 74 IU/L (ref 39–117)
BUN/Creatinine Ratio: 10 (ref 9–20)
BUN: 8 mg/dL (ref 6–20)
Bilirubin Total: 0.6 mg/dL (ref 0.0–1.2)
CO2: 22 mmol/L (ref 20–29)
Calcium: 9.2 mg/dL (ref 8.7–10.2)
Chloride: 103 mmol/L (ref 96–106)
Creatinine, Ser: 0.82 mg/dL (ref 0.76–1.27)
GFR calc Af Amer: 138 mL/min/1.73
GFR calc non Af Amer: 119 mL/min/1.73
Globulin, Total: 3.1 g/dL (ref 1.5–4.5)
Glucose: 116 mg/dL — ABNORMAL HIGH (ref 65–99)
Potassium: 3.9 mmol/L (ref 3.5–5.2)
Sodium: 144 mmol/L (ref 134–144)
Total Protein: 6.7 g/dL (ref 6.0–8.5)

## 2018-07-11 LAB — URINE CULTURE

## 2018-07-15 ENCOUNTER — Telehealth: Payer: Self-pay | Admitting: *Deleted

## 2018-07-15 NOTE — Telephone Encounter (Signed)
-----   Message from Cain Saupe, MD sent at 07/13/2018 10:48 PM EST ----- Please notify patient that his blood sugar was 116 on recent BMP and patient with a mild decrease in albumin which can indicate the need for additional protein in the diet/nutritional deficiency..  Patient also with a mild increase in 2 of his liver enzymes, AST and ALT with AST being 43 and normal being 0-40 and ALT being 50 with normal being 0-44.  Patient should avoid alcohol, Tylenol and fatty meals and should have his LFTs possibly rechecked at his next visit or within the next 4 to 6 months.  Urine culture did show the growth of some bacteria however it was not considered to be enough bacterial growth to perform identification and sensitivity of bacteria.  Please complete the prescribed antibiotic and return if you have any continued urinary symptoms

## 2018-07-15 NOTE — Telephone Encounter (Signed)
Patient verified DOB Patient is aware of needing to increase protein intake and decrease fatty food, alcohol, tylenol intake. Patient is aware of needing to complete antibiotics but no treatment being needed. Patient is aware of a recheck of all levels will completed at the next visit. No further questions.

## 2018-07-27 ENCOUNTER — Encounter: Payer: Self-pay | Admitting: Family Medicine

## 2018-07-27 ENCOUNTER — Other Ambulatory Visit: Payer: Self-pay

## 2018-07-27 ENCOUNTER — Ambulatory Visit: Payer: Self-pay | Attending: Family Medicine | Admitting: Family Medicine

## 2018-07-27 VITALS — BP 156/92 | HR 103 | Temp 98.2°F | Resp 18 | Ht 70.0 in | Wt >= 6400 oz

## 2018-07-27 DIAGNOSIS — I10 Essential (primary) hypertension: Secondary | ICD-10-CM

## 2018-07-27 DIAGNOSIS — R82998 Other abnormal findings in urine: Secondary | ICD-10-CM

## 2018-07-27 DIAGNOSIS — Z794 Long term (current) use of insulin: Principal | ICD-10-CM

## 2018-07-27 DIAGNOSIS — J45909 Unspecified asthma, uncomplicated: Secondary | ICD-10-CM | POA: Insufficient documentation

## 2018-07-27 DIAGNOSIS — E119 Type 2 diabetes mellitus without complications: Secondary | ICD-10-CM

## 2018-07-27 DIAGNOSIS — R0981 Nasal congestion: Secondary | ICD-10-CM

## 2018-07-27 DIAGNOSIS — R0982 Postnasal drip: Secondary | ICD-10-CM | POA: Insufficient documentation

## 2018-07-27 DIAGNOSIS — Z87891 Personal history of nicotine dependence: Secondary | ICD-10-CM | POA: Insufficient documentation

## 2018-07-27 LAB — POCT URINALYSIS DIP (CLINITEK)
Bilirubin, UA: NEGATIVE
Blood, UA: NEGATIVE
Glucose, UA: NEGATIVE mg/dL
Ketones, POC UA: NEGATIVE mg/dL
Nitrite, UA: NEGATIVE
POC PROTEIN,UA: NEGATIVE
Spec Grav, UA: 1.02
Urobilinogen, UA: 0.2 U/dL
pH, UA: 5.5

## 2018-07-27 LAB — GLUCOSE, POCT (MANUAL RESULT ENTRY): POC Glucose: 111 mg/dL — AB (ref 70–99)

## 2018-07-27 MED ORDER — GLUCOSE BLOOD VI STRP: ORAL_STRIP | 11 refills | Status: DC

## 2018-07-27 MED ORDER — FREESTYLE LANCETS MISC: 11 refills | Status: DC

## 2018-07-27 MED ORDER — METFORMIN HCL 500 MG PO TABS: 500.0000 mg | ORAL_TABLET | Freq: Two times a day (BID) | ORAL | 5 refills | Status: DC

## 2018-07-27 MED ORDER — HYDROCHLOROTHIAZIDE 25 MG PO TABS: 25.0000 mg | ORAL_TABLET | Freq: Every day | ORAL | 1 refills | Status: DC

## 2018-07-27 MED ORDER — LORATADINE 10 MG PO TABS: 10.0000 mg | ORAL_TABLET | Freq: Every day | ORAL | 11 refills | Status: DC

## 2018-07-27 MED ORDER — LOSARTAN POTASSIUM 100 MG PO TABS: ORAL_TABLET | ORAL | 4 refills | Status: DC

## 2018-07-27 MED ORDER — INSULIN GLARGINE 100 UNIT/ML SOLOSTAR PEN: 42.0000 [IU] | PEN_INJECTOR | Freq: Every day | SUBCUTANEOUS | 6 refills | Status: DC

## 2018-07-27 MED ORDER — TRUEPLUS LANCETS 28G MISC: 11 refills | Status: AC

## 2018-07-27 MED ORDER — GLUCOSE BLOOD VI STRP: ORAL_STRIP | 11 refills | Status: AC

## 2018-07-27 MED FILL — TRUEplus LANCETS 28G MISC: 30 days supply | Qty: 100 | Fill #0

## 2018-07-27 MED FILL — metFORMIN HCL 500 MG TABS: 500 | 30 days supply | Qty: 60 | Fill #0

## 2018-07-27 MED FILL — LOSARTAN POTASSIUM 100 MG T: 100 | 30 days supply | Qty: 30 | Fill #0

## 2018-07-27 MED FILL — !LANTUS 100 UNITS/ML VIAL: 100 | 23 days supply | Qty: 10 | Fill #0

## 2018-07-27 MED FILL — HYDROCHLOROTHIAZIDE 25 MG T: 25 | 30 days supply | Qty: 30 | Fill #0

## 2018-07-27 MED FILL — TRUE METRIX TEST STRIP: 30 days supply | Qty: 100 | Fill #0

## 2018-07-27 NOTE — Progress Notes (Signed)
Subjective:    Patient ID: Louis [name], male    DOB: 1990/12/08, 28 y.o.   MRN: [id number]  HPI       28 year old male seen in follow-up of recent diagnosis of diabetes after developing onset of increased thirst and urinary frequency.  Patient was diagnosed 06/22/2018 when he presented to the emergency department.  Patient's random glucose at today's visit is 111.  Patient is currently on Lantus, NovoLog and metformin.  Patient additionally is on losartan and hydrochlorothiazide for control of blood pressure.      Patient feels that his blood sugars have stabilized after starting the metformin.  Patient states that now his blood sugars may rise up to 160 immediately after a meal but blood sugar is back within the low 100s, less than 120 within 2 hours.  Patient has stopped taking short acting insulin pre-meal.  Patient continues to take Lantus 42 units daily.  Patient needs refill of Lantus and metformin.  Patient has had no difficulty tolerating the metformin, no nausea, no abdominal pain and no loose stools or diarrhea.  Patient did receive notification of mild increase in liver enzymes on his most recent lab work.  Patient reports that he did take the antibiotics after his last appointment and has urinary frequency and urgency have gone away.      Patient denies any headaches or dizziness but he does have elevated blood pressure today's visit.  Patient reports that he is taking blood pressure medicine daily.  Patient also reports that since his last visit, he has felt as if he has had additional weight loss including decreased peripheral edema.  Patient has had some sensation of a tickling in his throat as well as some nasal congestion.  Patient denies any dry cough.  Patient believes he does have some postnasal drainage.       Past Medical History:  Diagnosis Date  . Asthma   . Diabetes mellitus without complication (HCC)   . Hypertension    Past Surgical History:  Procedure Laterality Date    . LEG SURGERY     Family History  Problem Relation Age of Onset  . Diabetes Father   . Diabetes Brother    Social History   Tobacco Use  . Smoking status: Former Games developer  . Smokeless tobacco: Never Used  Substance Use Topics  . Alcohol use: Not Currently  . Drug use: Yes    Types: Marijuana   Allergies  Allergen Reactions  . Latex Rash     Review of Systems  Constitutional: Negative for chills, fatigue and fever.  HENT: Positive for congestion and postnasal drip. Negative for sore throat and trouble swallowing.   Eyes: Negative for photophobia and visual disturbance.  Respiratory: Negative for cough and shortness of breath.   Cardiovascular: Positive for leg swelling (improved). Negative for chest pain and palpitations.  Gastrointestinal: Negative for abdominal pain, constipation, diarrhea and nausea.  Genitourinary: Negative for dysuria and frequency.  Musculoskeletal: Negative for arthralgias and back pain.  Neurological: Negative for dizziness and headaches.  Hematological: Negative for adenopathy. Does not bruise/bleed easily.       Objective:   Physical Exam BP (!) 156/92 (BP Location: Left Arm, Patient Position: Sitting, Cuff Size: Large) Comment (Cuff Size): thigh cuff  Pulse (!) 103   Temp 98.2 F (36.8 C) (Oral)   Resp 18   Ht 5\' 10"  (1.778 m)   Wt (!) 471 lb (213.6 kg)   SpO2 97%   BMI 67.58  kg/m Nurse's notes and vital signs reviewed General-well-nourished, well-developed morbidly obese male in no acute distress ENT-TMs dull, edema of the nasal turbinates, patient with posterior pharynx erythema, narrowed posterior airway secondary to body habitus and large tongue base Neck-large neck size, no lymphadenopathy Cardiovascular-regular rate and rhythm Lungs-clear to auscultation bilaterally with mild decrease at the lung bases likely secondary to body habitus, breathing is unlabored Abdomen-truncal obesity, soft and nontender Back-no CVA  tenderness Extremities-patient with mild, nonpitting bilateral lower extremity edema.  At today's visit, patient skin is soft and not taut (lower extremity edema improved since last visit)      Assessment & Plan:  1. Type 2 diabetes mellitus without complication, with long-term current use of insulin (HCC) Patient reports improved blood sugar control with use of Glucophage and patient has stopped the use of short acting insulin pre-meal as his blood sugars are generally 140 or less 2 hours postprandially.  Patient given refills of Lantus and metformin and patient will have CMP at today's visit.  Patient previously had elevated LFTs during hospitalization but these have been trending back towards normal - Glucose (CBG) - Comprehensive metabolic panel - Insulin Glargine (LANTUS) 100 UNIT/ML Solostar Pen; Inject 42 Units into the skin daily at 10 pm.  Dispense: 20 mL; Refill: 6 - metFORMIN (GLUCOPHAGE) 500 MG tablet; Take 1 tablet (500 mg total) by mouth 2 (two) times daily with a meal.  Dispense: 60 tablet; Refill: 5  2. Leukocytes in urine Patient status post treatment for recent abnormal urinalysis/urinary tract infection.  Patient states that his urinary frequency has resolved however on repeat urinalysis today, patient with continued leukocytes in the urine.  Urine will be sent for culture to see if he may have another bladder infection or if this may be yeast related due to prior uncontrolled diabetes as well as recent antibiotic use - Urine Culture - POCT URINALYSIS DIP (CLINITEK)  3. Essential hypertension Patient's blood pressure still elevated at today's visit.  Prescription provided for Cozaar which will be increased to 100 mg daily and new refill for hydrochlorothiazide.  Patient is to return to clinic in a few weeks for blood pressure recheck - losartan (COZAAR) 100 MG tablet; To lower blood pressure  Dispense: 30 tablet; Refill: 4 - hydrochlorothiazide (HYDRODIURIL) 25 MG tablet; Take  1 tablet (25 mg total) by mouth daily. Too lower blood pressure and decrease swelling  Dispense: 30 tablet; Refill: 1  4. Nasal congestion Patient with complaint of tickling sensation in the throat and postnasal drainage.  Prescription provided for loratadine 10 mg daily to help with nasal congestion, postnasal drainage and patient should return to clinic if symptoms do not improve within the next few weeks - loratadine (CLARITIN) 10 MG tablet; Take 1 tablet (10 mg total) by mouth daily. As needed for nasal congestion  Dispense: 30 tablet; Refill: 11  An After Visit Summary was printed and given to the patient.  Return in about 3 months (around 10/25/2018) for DM; 2 week NV for BP recheck.

## 2018-07-28 ENCOUNTER — Encounter: Payer: Self-pay | Admitting: Family Medicine

## 2018-07-28 LAB — COMPREHENSIVE METABOLIC PANEL WITH GFR
ALT: 57 IU/L — ABNORMAL HIGH (ref 0–44)
AST: 42 IU/L — ABNORMAL HIGH (ref 0–40)
Albumin/Globulin Ratio: 1.3 (ref 1.2–2.2)
Albumin: 4.3 g/dL (ref 4.1–5.2)
Alkaline Phosphatase: 77 IU/L (ref 39–117)
BUN/Creatinine Ratio: 15 (ref 9–20)
BUN: 14 mg/dL (ref 6–20)
Bilirubin Total: 0.4 mg/dL (ref 0.0–1.2)
CO2: 21 mmol/L (ref 20–29)
Calcium: 9.5 mg/dL (ref 8.7–10.2)
Chloride: 102 mmol/L (ref 96–106)
Creatinine, Ser: 0.94 mg/dL (ref 0.76–1.27)
GFR calc Af Amer: 126 mL/min/1.73
GFR calc non Af Amer: 109 mL/min/1.73
Globulin, Total: 3.2 g/dL (ref 1.5–4.5)
Glucose: 83 mg/dL (ref 65–99)
Potassium: 4.3 mmol/L (ref 3.5–5.2)
Sodium: 140 mmol/L (ref 134–144)
Total Protein: 7.5 g/dL (ref 6.0–8.5)

## 2018-07-28 MED ORDER — HYDROCHLOROTHIAZIDE 25 MG PO TABS: 25.0000 mg | ORAL_TABLET | Freq: Every day | ORAL | 1 refills | Status: DC

## 2018-07-29 LAB — URINE CULTURE: Organism ID, Bacteria: NO GROWTH

## 2018-07-31 ENCOUNTER — Telehealth: Payer: Self-pay | Admitting: *Deleted

## 2018-07-31 NOTE — Telephone Encounter (Signed)
-----   Message from Cain Saupe, MD sent at 07/29/2018 12:39 PM EST ----- No growth of bacteria on urine culture and CMET normal with the exception of a mild increase in liver enzymes- patient should avoid use of alcohol/tylenol and control his weight

## 2018-07-31 NOTE — Telephone Encounter (Signed)
Patient verified DOB Patient is aware of no bacteria growing on culture and normal CMET except for liver enzymes and patient is aware of eliminating alcohol and tylenol and controlling his weight. No further question.

## 2018-09-01 MED FILL — HYDROCHLOROTHIAZIDE 25 MG T: 25 | 90 days supply | Qty: 90 | Fill #0

## 2018-09-01 MED FILL — metFORMIN HCL 500 MG TABS: 500 | 90 days supply | Qty: 180 | Fill #1

## 2018-09-01 MED FILL — $LANTUS SOLOSTAR 100 UNITS/: 100 | 92 days supply | Qty: 39 | Fill #0

## 2018-09-01 MED FILL — LOSARTAN POTASSIUM 100 MG T: 100 | 90 days supply | Qty: 90 | Fill #1

## 2018-09-01 MED FILL — TRUE METRIX TEST STRIP: 90 days supply | Qty: 300 | Fill #1

## 2018-10-28 ENCOUNTER — Ambulatory Visit: Payer: Self-pay | Admitting: Family Medicine

## 2018-12-02 MED FILL — $LANTUS SOLOSTAR 100 UNITS/: 100 | 92 days supply | Qty: 39 | Fill #1

## 2018-12-02 MED FILL — HYDROCHLOROTHIAZIDE 25 MG T: 25 | 90 days supply | Qty: 90 | Fill #1

## 2018-12-02 MED FILL — LOSARTAN POTASSIUM 100 MG T: 100 | 30 days supply | Qty: 30 | Fill #2

## 2018-12-02 MED FILL — TRUE METRIX TEST STRIP: 90 days supply | Qty: 300 | Fill #2

## 2019-01-07 ENCOUNTER — Other Ambulatory Visit: Payer: Self-pay | Admitting: Family Medicine

## 2019-01-07 DIAGNOSIS — I10 Essential (primary) hypertension: Secondary | ICD-10-CM

## 2019-01-07 MED FILL — LOSARTAN POTASSIUM 100 MG T: 100 | 30 days supply | Qty: 30 | Fill #0

## 2019-01-07 MED FILL — TRUEplus LANCETS 28G MISC: 30 days supply | Qty: 100 | Fill #1

## 2019-02-08 ENCOUNTER — Other Ambulatory Visit: Payer: Self-pay | Admitting: Family Medicine

## 2019-02-08 ENCOUNTER — Telehealth: Payer: Self-pay | Admitting: Family Medicine

## 2019-02-08 DIAGNOSIS — I10 Essential (primary) hypertension: Secondary | ICD-10-CM

## 2019-02-08 MED ORDER — LOSARTAN POTASSIUM 100 MG PO TABS: ORAL_TABLET | ORAL | 0 refills | Status: DC

## 2019-02-08 MED FILL — $LANTUS SOLOSTAR 100 UNITS/: 100 | 28 days supply | Qty: 12 | Fill #2

## 2019-02-08 NOTE — Progress Notes (Signed)
Patient ID: Louis [name], male   DOB: [birth year], 28 y.o.   MRN: [id number]   Refill request received for losartan 100 mg.  Will send in 30-day supply and patient has upcoming appointment 02/11/2019.

## 2019-02-08 NOTE — Telephone Encounter (Signed)
[facility] and patient will need to keep upcoming appointment on 02/11/2019 for future refills

## 2019-02-08 NOTE — Telephone Encounter (Signed)
1) Medication(s) Requested (by name): Losartan  2) Pharmacy of Choice: [email or web address] 3) Special Requests:   Approved medications will be sent to the pharmacy, we will reach out if there is an issue.  Requests made after 3pm may not be addressed until the following business day!  If a patient is unsure of the name of the medication(s) please note and ask patient to call back when they are able to provide all info, do not send to responsible party until all information is available!

## 2019-02-09 MED FILL — LOSARTAN POTASSIUM 100 MG T: 100 | 30 days supply | Qty: 30 | Fill #0

## 2019-02-09 NOTE — Telephone Encounter (Signed)
Informed patient with information and he verbalized understanding.  

## 2019-02-11 ENCOUNTER — Other Ambulatory Visit: Payer: Self-pay | Admitting: Pharmacist

## 2019-02-11 ENCOUNTER — Other Ambulatory Visit: Payer: Self-pay

## 2019-02-11 ENCOUNTER — Ambulatory Visit: Payer: Self-pay | Attending: Family Medicine | Admitting: Physician Assistant

## 2019-02-11 VITALS — BP 169/112 | HR 96 | Temp 98.1°F | Ht 70.0 in | Wt >= 6400 oz

## 2019-02-11 DIAGNOSIS — Z794 Long term (current) use of insulin: Secondary | ICD-10-CM

## 2019-02-11 DIAGNOSIS — I10 Essential (primary) hypertension: Secondary | ICD-10-CM

## 2019-02-11 DIAGNOSIS — E119 Type 2 diabetes mellitus without complications: Secondary | ICD-10-CM

## 2019-02-11 LAB — POCT GLYCOSYLATED HEMOGLOBIN (HGB A1C): Hemoglobin A1C: 5.8 % — AB (ref 4.0–5.6)

## 2019-02-11 LAB — GLUCOSE, POCT (MANUAL RESULT ENTRY): POC Glucose: 119 mg/dl — AB (ref 70–99)

## 2019-02-11 MED ORDER — LOSARTAN POTASSIUM 100 MG PO TABS: ORAL_TABLET | ORAL | 1 refills | Status: DC

## 2019-02-11 MED ORDER — HYDROCHLOROTHIAZIDE 25 MG PO TABS: 25.0000 mg | ORAL_TABLET | Freq: Every day | ORAL | 1 refills | Status: DC

## 2019-02-11 MED ORDER — INSULIN GLARGINE 100 UNIT/ML SOLOSTAR PEN: 42.0000 [IU] | PEN_INJECTOR | Freq: Every day | SUBCUTANEOUS | 6 refills | Status: DC

## 2019-02-11 MED ORDER — TRUEPLUS PEN NEEDLES 32G X 4 MM MISC: 11 refills | Status: DC

## 2019-02-11 MED FILL — TRUEPLUS PEN NDL 32GX5/32: 32G X 4 MM | 30 days supply | Qty: 100 | Fill #0

## 2019-02-11 NOTE — Patient Instructions (Signed)
Check BP regularly-goal <130/85 Check blood sugars regularly-goal <120 Eliminate carbs and sugars from your diet.

## 2019-02-11 NOTE — Progress Notes (Signed)
Patient ID: Louis [name], male   DOB: 02-Sep-1990, 28 y.o.   MRN: [id number]   Louis [name], is a 28 y.o. male  [id number]  [id number]  DOB - 02-Sep-1990  Subjective:  Chief Complaint and HPI: Louis [name] is a 28 y.o. male here for med RF.  Out of BP meds for 4 days.  Only taking lantus for diabetes and counting carbs.  HA only when out of BP meds.  No CP/sob/dizziness. Blood sugars at home <130.   BP at home 120-128/70-80 when on meds.  ROS:   Constitutional:  No f/c, No night sweats, No unexplained weight loss. EENT:  No vision changes, No blurry vision, No hearing changes. No mouth, throat, or ear problems.  Respiratory: No cough, No SOB Cardiac: No CP, no palpitations GI:  No abd pain, No N/V/D. GU: No Urinary s/sx Musculoskeletal: No joint pain Neuro: No headache, no dizziness, no motor weakness.  Skin: No rash Endocrine:  No polydipsia. No polyuria.  Psych: Denies SI/HI  No problems updated.  ALLERGIES: Allergies  Allergen Reactions  . Latex Rash    PAST MEDICAL HISTORY: Past Medical History:  Diagnosis Date  . Asthma   . Diabetes mellitus without complication (HCC)   . Hypertension     MEDICATIONS AT HOME: Prior to Admission medications   Medication Sig Start Date End Date Taking? Authorizing Provider  glucose blood (TRUE METRIX BLOOD GLUCOSE TEST) test strip Use as directed three times daily to check blood sugar 07/27/18   Fulp, Cammie, MD  hydrochlorothiazide (HYDRODIURIL) 25 MG tablet Take 1 tablet (25 mg total) by mouth daily. To lower blood pressure and decrease swelling 02/11/19   Georgian CoMcClung, Bazil Dhanani M, [place]  Insulin Glargine (LANTUS) 100 UNIT/ML Solostar Pen Inject 42 Units into the skin daily at 10 pm. 02/11/19   Anders SimmondsMcClung, Keela Rubert M, PA-C  Insulin Pen Needle 32G X 8 MM MISC Use as directed 06/26/18   Ghimire, Werner LeanShanker M, MD  loratadine (CLARITIN) 10 MG tablet Take 1 tablet (10 mg total) by mouth daily. As needed for nasal congestion 07/27/18   Fulp,  Cammie, MD  losartan (COZAAR) 100 MG tablet TAKE 1 TABLET BY MOUTH DAILY. 02/11/19   Anders SimmondsMcClung, Humza Tallerico M, PA-C  TRUEPLUS LANCETS 28G MISC Use as directed three times daily to check blood sugar 07/27/18   Fulp, Cammie, MD     Objective:  EXAM:   Vitals:   02/11/19 0858  BP: (!) 169/112  Pulse: 96  Temp: 98.1 F (36.7 C)  TempSrc: Oral  SpO2: 97%  Weight: (!) 469 lb 6.4 oz (212.9 kg)  Height: 5\' 10"  (1.778 m)    General appearance : A&OX3. NAD. Non-toxic-appearing HEENT: Atraumatic and Normocephalic.  PERRLA. EOM intact.   Neck: supple, no JVD. No cervical lymphadenopathy. No thyromegaly Chest/Lungs:  Breathing-non-labored, Good air entry bilaterally, breath sounds normal without rales, rhonchi, or wheezing  CVS: S1 S2 regular, no murmurs, gallops, rubs  Psych:  TP linear. J/I WNL. Normal speech. Appropriate eye contact and affect.  Skin:  No Rash  Data Review Lab Results  Component Value Date   HGBA1C 5.8 (A) 02/11/2019   HGBA1C 11.6 (H) 06/23/2018     Assessment & Plan   1. Type 2 diabetes mellitus without complication, with long-term current use of insulin (HCC) Controlled!!! continue to watch/eliminate carbs.  Continue current regimen - Glucose (CBG) - HgB A1c - Insulin Glargine (LANTUS) 100 UNIT/ML Solostar Pen; Inject 42 Units into the skin daily at 10 pm.  Dispense:  20 mL; Refill: 6 - Comprehensive metabolic panel - Lipid panel  2. Essential hypertension Uncontrolled off meds but controlled at home.  Resume meds - losartan (COZAAR) 100 MG tablet; TAKE 1 TABLET BY MOUTH DAILY.  Dispense: 90 tablet; Refill: 1 - hydrochlorothiazide (HYDRODIURIL) 25 MG tablet; Take 1 tablet (25 mg total) by mouth daily. To lower blood pressure and decrease swelling  Dispense: 90 tablet; Refill: 1 - CBC with Differential/Platelet; Future - CBC with Differential/Platelet     Patient have been counseled extensively about nutrition and exercise  Return in about 3 months (around  05/14/2019) for chronic medical conditions with PCP.  The patient was given clear instructions to go to ER or return to medical center if symptoms don't improve, worsen or new problems develop. The patient verbalized understanding. The patient was told to call to get lab results if they haven't heard anything in the next week.     Freeman Caldron, PA-C [facility] and [facility] [place], [place]   02/11/2019, 9:05 AM

## 2019-02-12 LAB — COMPREHENSIVE METABOLIC PANEL
ALT: 18 IU/L (ref 0–44)
AST: 19 IU/L (ref 0–40)
Albumin/Globulin Ratio: 1.4 (ref 1.2–2.2)
Albumin: 4 g/dL — ABNORMAL LOW (ref 4.1–5.2)
Alkaline Phosphatase: 75 IU/L (ref 39–117)
BUN/Creatinine Ratio: 21 — ABNORMAL HIGH (ref 9–20)
BUN: 18 mg/dL (ref 6–20)
Bilirubin Total: 0.4 mg/dL (ref 0.0–1.2)
CO2: 21 mmol/L (ref 20–29)
Calcium: 9.2 mg/dL (ref 8.7–10.2)
Chloride: 102 mmol/L (ref 96–106)
Creatinine, Ser: 0.85 mg/dL (ref 0.76–1.27)
GFR calc Af Amer: 135 mL/min/{1.73_m2} (ref >59)
GFR calc non Af Amer: 117 mL/min/{1.73_m2} (ref >59)
Globulin, Total: 2.9 g/dL (ref 1.5–4.5)
Glucose: 108 mg/dL — ABNORMAL HIGH (ref 65–99)
Potassium: 4.1 mmol/L (ref 3.5–5.2)
Sodium: 140 mmol/L (ref 134–144)
Total Protein: 6.9 g/dL (ref 6.0–8.5)

## 2019-02-12 LAB — CBC WITH DIFFERENTIAL/PLATELET
Basophils Absolute: 0.1 10*3/uL (ref 0.0–0.2)
Basos: 1 %
EOS (ABSOLUTE): 0.1 10*3/uL (ref 0.0–0.4)
Eos: 1 %
Hematocrit: 41.7 % (ref 37.5–51.0)
Hemoglobin: 13.5 g/dL (ref 13.0–17.7)
Immature Grans (Abs): 0 10*3/uL (ref 0.0–0.1)
Immature Granulocytes: 0 %
Lymphocytes Absolute: 2.4 10*3/uL (ref 0.7–3.1)
Lymphs: 21 %
MCH: 30 pg (ref 26.6–33.0)
MCHC: 32.4 g/dL (ref 31.5–35.7)
MCV: 93 fL (ref 79–97)
Monocytes Absolute: 1 10*3/uL — ABNORMAL HIGH (ref 0.1–0.9)
Monocytes: 9 %
Neutrophils Absolute: 7.8 10*3/uL — ABNORMAL HIGH (ref 1.4–7.0)
Neutrophils: 68 %
Platelets: 226 10*3/uL (ref 150–450)
RBC: 4.5 x10E6/uL (ref 4.14–5.80)
RDW: 14.2 % (ref 11.6–15.4)
WBC: 11.5 10*3/uL — ABNORMAL HIGH (ref 3.4–10.8)

## 2019-02-12 LAB — LIPID PANEL
Chol/HDL Ratio: 2.4 ratio (ref 0.0–5.0)
Cholesterol, Total: 154 mg/dL (ref 100–199)
HDL: 63 mg/dL (ref >39)
LDL Calculated: 79 mg/dL (ref 0–99)
Triglycerides: 59 mg/dL (ref 0–149)
VLDL Cholesterol Cal: 12 mg/dL (ref 5–40)

## 2019-03-19 MED FILL — LOSARTAN POTASSIUM 100 MG T: 100 | 30 days supply | Qty: 30 | Fill #0

## 2019-03-19 MED FILL — TRUEPLUS PEN NDL 32GX5/32: 32G X 4 MM | 30 days supply | Qty: 100 | Fill #0

## 2019-03-19 MED FILL — TRUE METRIX TEST STRIP: 30 days supply | Qty: 100 | Fill #3

## 2019-03-19 MED FILL — TRUEPLUS PEN NDL 32GX5/32": 32G X 4 MM | 30 days supply | Qty: 100 | Fill #0

## 2019-03-19 MED FILL — HYDROCHLOROTHIAZIDE 25 MG T: 25 | 30 days supply | Qty: 30 | Fill #0

## 2019-03-19 MED FILL — $LANTUS SOLOSTAR 100 UNITS/: 100 | 28 days supply | Qty: 12 | Fill #3

## 2019-04-19 MED FILL — HYDROCHLOROTHIAZIDE 25 MG T: 25 | 30 days supply | Qty: 30 | Fill #1

## 2019-04-19 MED FILL — $LANTUS SOLOSTAR 100 UNITS/: 100 | 28 days supply | Qty: 12 | Fill #4

## 2019-04-19 MED FILL — LOSARTAN POTASSIUM 100 MG T: 100 | 30 days supply | Qty: 30 | Fill #1

## 2019-05-19 ENCOUNTER — Other Ambulatory Visit: Payer: Self-pay

## 2019-05-19 ENCOUNTER — Encounter: Payer: Self-pay | Admitting: Family Medicine

## 2019-05-19 ENCOUNTER — Ambulatory Visit: Payer: Self-pay | Attending: Family Medicine | Admitting: Family Medicine

## 2019-05-19 DIAGNOSIS — Z5329 Procedure and treatment not carried out because of patient's decision for other reasons: Secondary | ICD-10-CM

## 2019-05-19 DIAGNOSIS — Z91199 Patient's noncompliance with other medical treatment and regimen due to unspecified reason: Secondary | ICD-10-CM

## 2019-05-19 NOTE — Progress Notes (Signed)
Patient verified DOB Patient has eaten today. Patient has taken medication today. Patient denies pain at this time. CBG this morning before eating was 102.

## 2019-05-19 NOTE — Progress Notes (Signed)
   Virtual Visit via Telephone Note  I was attempted to connect with Elberta Fortis on 05/19/19 at  2:10 PM EST by telephone and  To verify  that I was speaking with the correct person using two identifiers.   I discussed the limitations, risks, security and privacy concerns of performing an evaluation and management service by telephone and the availability of in person appointments. I also discussed with the patient that there may be a patient responsible charge related to this service. The patient expressed understanding and agreed to proceed.  Patient Location:  Provider Location: [facility] Others participating in call: none   *Unable to contact patient by phone to start visit. Verne Grain, MD  History of Present Illness:   Past Medical History:  Diagnosis Date  . Asthma   . Diabetes mellitus without complication ([place])   . Hypertension     Past Surgical History:  Procedure Laterality Date  . LEG SURGERY      Family History  Problem Relation Age of Onset  . Diabetes Father   . Diabetes Brother     Social History   Tobacco Use  . Smoking status: Former Research scientist (life sciences)  . Smokeless tobacco: Never Used  Substance Use Topics  . Alcohol use: Not Currently  . Drug use: Yes    Types: Marijuana     Allergies  Allergen Reactions  . Latex Rash       Observations/Objective: No vital signs or physical exam conducted as visit was done via telephone  Assessment and Plan:   Follow Up Instructions:    I discussed the assessment and treatment plan with the patient. The patient was provided an opportunity to ask questions and all were answered. The patient agreed with the plan and demonstrated an understanding of the instructions.   The patient was advised to call back or seek an in-person evaluation if the symptoms worsen or if the condition fails to improve as anticipated.  I provided 0 minutes of non-face-to-face time during this encounter.   Antony Blackbird, MD

## 2019-05-24 MED FILL — $LANTUS SOLOSTAR 100 UNITS/: 100 | 28 days supply | Qty: 12 | Fill #5

## 2019-05-24 MED FILL — HYDROCHLOROTHIAZIDE 25 MG T: 25 | 30 days supply | Qty: 30 | Fill #2

## 2019-05-24 MED FILL — LOSARTAN POTASSIUM 100 MG T: 100 | 30 days supply | Qty: 30 | Fill #2

## 2019-06-24 MED FILL — LOSARTAN POTASSIUM 100 MG T: 100 | 30 days supply | Qty: 30 | Fill #3

## 2019-06-24 MED FILL — $LANTUS SOLOSTAR 100 UNITS/: 100 | 28 days supply | Qty: 12 | Fill #6

## 2019-06-24 MED FILL — HYDROCHLOROTHIAZIDE 25 MG T: 25 | 30 days supply | Qty: 30 | Fill #3

## 2019-07-23 MED FILL — $LANTUS SOLOSTAR 100 UNITS/: 100 | 28 days supply | Qty: 12 | Fill #0

## 2019-07-23 MED FILL — HYDROCHLOROTHIAZIDE 25 MG T: 25 | 30 days supply | Qty: 30 | Fill #4

## 2019-07-23 MED FILL — LOSARTAN POTASSIUM 100 MG T: 100 | 30 days supply | Qty: 30 | Fill #4

## 2019-07-26 ENCOUNTER — Ambulatory Visit: Payer: Self-pay | Attending: Family Medicine

## 2019-07-28 ENCOUNTER — Other Ambulatory Visit: Payer: Self-pay

## 2019-08-23 MED FILL — LOSARTAN POTASSIUM 100 MG T: 100 | 30 days supply | Qty: 30 | Fill #5

## 2019-08-23 MED FILL — HYDROCHLOROTHIAZIDE 25 MG T: 25 | 30 days supply | Qty: 30 | Fill #5

## 2019-08-23 MED FILL — !LANTUS SOLOSTAR 100UNITS/M: 100 | 14 days supply | Qty: 6 | Fill #1

## 2019-09-09 MED FILL — !LANTUS SOLOSTAR 100UNITS/M: 100 | 7 days supply | Qty: 3 | Fill #2

## 2019-09-16 ENCOUNTER — Other Ambulatory Visit: Payer: Self-pay | Admitting: Family Medicine

## 2019-09-16 ENCOUNTER — Other Ambulatory Visit: Payer: Self-pay | Admitting: Physician Assistant

## 2019-09-16 DIAGNOSIS — I10 Essential (primary) hypertension: Secondary | ICD-10-CM

## 2019-09-16 DIAGNOSIS — R0981 Nasal congestion: Secondary | ICD-10-CM

## 2019-09-16 MED FILL — $LANTUS SOLOSTAR 100 UNITS/: 100 | 35 days supply | Qty: 15 | Fill #3

## 2019-09-23 ENCOUNTER — Other Ambulatory Visit: Payer: Self-pay | Admitting: Physician Assistant

## 2019-09-23 DIAGNOSIS — I10 Essential (primary) hypertension: Secondary | ICD-10-CM

## 2019-09-24 MED FILL — LOSARTAN POTASSIUM 100 MG T: 100 | 30 days supply | Qty: 30 | Fill #0

## 2019-09-24 MED FILL — HYDROCHLOROTHIAZIDE 25 MG T: 25 | 30 days supply | Qty: 30 | Fill #0

## 2019-10-21 MED FILL — $LANTUS SOLOSTAR 100 UNITS/: 100 | 35 days supply | Qty: 15 | Fill #4

## 2019-10-27 ENCOUNTER — Ambulatory Visit: Payer: Self-pay | Attending: Family Medicine | Admitting: Physician Assistant

## 2019-10-27 ENCOUNTER — Other Ambulatory Visit: Payer: Self-pay | Admitting: Pharmacist

## 2019-10-27 ENCOUNTER — Other Ambulatory Visit: Payer: Self-pay

## 2019-10-27 VITALS — BP 153/88 | HR 88 | Ht 70.0 in | Wt >= 6400 oz

## 2019-10-27 DIAGNOSIS — Z794 Long term (current) use of insulin: Secondary | ICD-10-CM

## 2019-10-27 DIAGNOSIS — I10 Essential (primary) hypertension: Secondary | ICD-10-CM | POA: Insufficient documentation

## 2019-10-27 DIAGNOSIS — E119 Type 2 diabetes mellitus without complications: Secondary | ICD-10-CM

## 2019-10-27 DIAGNOSIS — Z6841 Body Mass Index (BMI) 40.0 and over, adult: Secondary | ICD-10-CM

## 2019-10-27 LAB — GLUCOSE, POCT (MANUAL RESULT ENTRY): POC Glucose: 102 mg/dl — AB (ref 70–99)

## 2019-10-27 MED ORDER — INSULIN GLARGINE 100 UNIT/ML SOLOSTAR PEN: 42.0000 [IU] | PEN_INJECTOR | Freq: Every day | SUBCUTANEOUS | 6 refills | Status: DC

## 2019-10-27 MED ORDER — HYDROCHLOROTHIAZIDE 25 MG PO TABS: 25.0000 mg | ORAL_TABLET | Freq: Every day | ORAL | 0 refills | Status: DC

## 2019-10-27 MED ORDER — TRUEPLUS PEN NEEDLES 32G X 4 MM MISC: 11 refills | Status: DC

## 2019-10-27 MED ORDER — LOSARTAN POTASSIUM 100 MG PO TABS: ORAL_TABLET | ORAL | 1 refills | Status: DC

## 2019-10-27 MED ORDER — INSULIN GLARGINE 100 UNIT/ML SOLOSTAR PEN: 42.0000 [IU] | PEN_INJECTOR | Freq: Every day | SUBCUTANEOUS | 2 refills | Status: DC

## 2019-10-27 NOTE — Progress Notes (Signed)
Patient ID: Louis [name], male   DOB: [birth year], 29 y.o.   MRN: [id number]   Louis [name], is a 29 y.o. male  [id number]  [id number]  DOB - August 12, 1990  Subjective:  Chief Complaint and HPI: Louis [name] is a 29 y.o. male here today for med RF and bloodwork.  Blood sugars usu less than 120 but have been as high as 160 if he eats something he isn't supposed to.  Compliant with meds  ROS:   Constitutional:  No f/c, No night sweats, No unexplained weight loss. EENT:  No vision changes, No blurry vision, No hearing changes. No mouth, throat, or ear problems.  Respiratory: No cough, No SOB Cardiac: No CP, no palpitations GI:  No abd pain, No N/V/D. GU: No Urinary s/sx Musculoskeletal: No joint pain Neuro: No headache, no dizziness, no motor weakness.  Skin: No rash Endocrine:  No polydipsia. No polyuria.  Psych: Denies SI/HI  Problem  Type 2 Diabetes Mellitus Without Complication, With Long-Term Current Use of Insulin (Hcc)  Essential Hypertension    ALLERGIES: Allergies  Allergen Reactions  . Latex Rash    PAST MEDICAL HISTORY: Past Medical History:  Diagnosis Date  . Asthma   . Diabetes mellitus without complication (HCC)   . Hypertension     MEDICATIONS AT HOME: Prior to Admission medications   Medication Sig Start Date End Date Taking? Authorizing Provider  glucose blood (TRUE METRIX BLOOD GLUCOSE TEST) test strip Use as directed three times daily to check blood sugar 07/27/18  Yes Fulp, Cammie, MD  hydrochlorothiazide (HYDRODIURIL) 25 MG tablet Take 1 tablet (25 mg total) by mouth daily. 10/27/19  Yes Darryon Bastin M, PA-C  Insulin Pen Needle (TRUEPLUS PEN NEEDLES) 32G X 4 MM MISC Use as directed daily for insulin administration. 10/27/19  Yes Martine Bleecker M, PA-C  loratadine (CLARITIN) 10 MG tablet TAKE 1 TABLET (10 MG TOTAL) BY MOUTH DAILY. AS NEEDED FOR NASAL CONGESTION 09/16/19  Yes Fulp, Cammie, MD  losartan (COZAAR) 100 MG tablet TAKE 1 TABLET BY  MOUTH DAILY. 10/27/19  Yes Anders Simmonds, PA-C  TRUEPLUS LANCETS 28G MISC Use as directed three times daily to check blood sugar 07/27/18  Yes Fulp, Cammie, MD  insulin glargine (LANTUS) 100 UNIT/ML Solostar Pen Inject 42 Units into the skin daily at 10 pm. 10/27/19   Fulp, Cammie, MD     Objective:  EXAM:   Vitals:   10/27/19 1025  BP: (!) 153/88  Pulse: 88  SpO2: 100%  Weight: (!) 499 lb 6.4 oz (226.5 kg)  Height: 5\' 10"  (1.778 m)    General appearance : A&OX3. NAD. Non-toxic-appearing;  Morbidly obese.   HEENT: Atraumatic and Normocephalic.  PERRLA. EOM intact.   Neck: supple, no JVD. No cervical lymphadenopathy. No thyromegaly Chest/Lungs:  Breathing-non-labored, Good air entry bilaterally, breath sounds normal without rales, rhonchi, or wheezing  CVS: S1 S2 regular, no murmurs, gallops, rubs  Extremities: Bilateral Lower Ext shows no edema, both legs are warm to touch with = pulse throughout Neurology:  CN II-XII grossly intact, Non focal.   Psych:  TP linear. J/I WNL. Normal speech. Appropriate eye contact and affect.  Skin:  No Rash  Data Review Lab Results  Component Value Date   HGBA1C 5.8 (A) 02/11/2019   HGBA1C 11.6 (H) 06/23/2018     Assessment & Plan   1. Type 2 diabetes mellitus without complication, with long-term current use of insulin (HCC) Will order A1C to assess control and make adjustments  thereafter.  He does not adhere to diabetic diet-I have encouraged him at length to do so.   - Glucose (CBG) - Hemoglobin A1c - Insulin Pen Needle (TRUEPLUS PEN NEEDLES) 32G X 4 MM MISC; Use as directed daily for insulin administration.  Dispense: 100 each; Refill: 11 - Comprehensive metabolic panel - Lipid panel - CBC with Differential/Platelet  2. Essential hypertension Off meds X 5 days.  Resume meds and check BP OOO.   - hydrochlorothiazide (HYDRODIURIL) 25 MG tablet; Take 1 tablet (25 mg total) by mouth daily.  Dispense: 30 tablet; Refill: 0 - losartan  (COZAAR) 100 MG tablet; TAKE 1 TABLET BY MOUTH DAILY.  Dispense: 90 tablet; Refill: 1 - Comprehensive metabolic panel - CBC with Differential/Platelet  3. Morbid obesity with BMI of 70 and over, adult ([place]) Weight loss, diet, exercise recommended.     Patient have been counseled extensively about nutrition and exercise  Return in about 6 months (around 04/28/2020) for PCP;  chronic conditions.  The patient was given clear instructions to go to ER or return to medical center if symptoms don't improve, worsen or new problems develop. The patient verbalized understanding. The patient was told to call to get lab results if they haven't heard anything in the next week.     Freeman Caldron, PA-C [facility] and [facility] [place], [place]   10/27/2019, 11:49 AM

## 2019-10-28 ENCOUNTER — Other Ambulatory Visit: Payer: Self-pay | Admitting: Physician Assistant

## 2019-10-28 DIAGNOSIS — Z794 Long term (current) use of insulin: Secondary | ICD-10-CM

## 2019-10-28 LAB — CBC WITH DIFFERENTIAL/PLATELET
Basophils Absolute: 0.1 10*3/uL (ref 0.0–0.2)
Basos: 1 %
EOS (ABSOLUTE): 0.1 10*3/uL (ref 0.0–0.4)
Eos: 1 %
Hematocrit: 39.6 % (ref 37.5–51.0)
Hemoglobin: 13.5 g/dL (ref 13.0–17.7)
Immature Grans (Abs): 0.1 10*3/uL (ref 0.0–0.1)
Immature Granulocytes: 1 %
Lymphocytes Absolute: 2.3 10*3/uL (ref 0.7–3.1)
Lymphs: 21 %
MCH: 29.8 pg (ref 26.6–33.0)
MCHC: 34.1 g/dL (ref 31.5–35.7)
MCV: 87 fL (ref 79–97)
Monocytes Absolute: 1 10*3/uL — ABNORMAL HIGH (ref 0.1–0.9)
Monocytes: 9 %
Neutrophils Absolute: 7.4 10*3/uL — ABNORMAL HIGH (ref 1.4–7.0)
Neutrophils: 67 %
Platelets: 209 10*3/uL (ref 150–450)
RBC: 4.53 x10E6/uL (ref 4.14–5.80)
RDW: 15.5 % — ABNORMAL HIGH (ref 11.6–15.4)
WBC: 10.8 10*3/uL (ref 3.4–10.8)

## 2019-10-28 LAB — COMPREHENSIVE METABOLIC PANEL
ALT: 22 IU/L (ref 0–44)
AST: 26 IU/L (ref 0–40)
Albumin/Globulin Ratio: 1.4 (ref 1.2–2.2)
Albumin: 4.1 g/dL (ref 4.1–5.2)
Alkaline Phosphatase: 71 IU/L (ref 39–117)
BUN/Creatinine Ratio: 19 (ref 9–20)
BUN: 16 mg/dL (ref 6–20)
Bilirubin Total: 0.5 mg/dL (ref 0.0–1.2)
CO2: 20 mmol/L (ref 20–29)
Calcium: 8.9 mg/dL (ref 8.7–10.2)
Chloride: 106 mmol/L (ref 96–106)
Creatinine, Ser: 0.83 mg/dL (ref 0.76–1.27)
GFR calc Af Amer: 136 mL/min/{1.73_m2} (ref >59)
GFR calc non Af Amer: 118 mL/min/{1.73_m2} (ref >59)
Globulin, Total: 2.9 g/dL (ref 1.5–4.5)
Glucose: 100 mg/dL — ABNORMAL HIGH (ref 65–99)
Potassium: 4.4 mmol/L (ref 3.5–5.2)
Sodium: 143 mmol/L (ref 134–144)
Total Protein: 7 g/dL (ref 6.0–8.5)

## 2019-10-28 LAB — HEMOGLOBIN A1C
Est. average glucose Bld gHb Est-mCnc: 137 mg/dL
Hgb A1c MFr Bld: 6.4 % — ABNORMAL HIGH (ref 4.8–5.6)

## 2019-10-28 LAB — LIPID PANEL
Chol/HDL Ratio: 2.4 ratio (ref 0.0–5.0)
Cholesterol, Total: 144 mg/dL (ref 100–199)
HDL: 60 mg/dL (ref >39)
LDL Chol Calc (NIH): 70 mg/dL (ref 0–99)
Triglycerides: 67 mg/dL (ref 0–149)
VLDL Cholesterol Cal: 14 mg/dL (ref 5–40)

## 2019-10-28 MED ORDER — INSULIN GLARGINE 100 UNIT/ML SOLOSTAR PEN: 44.0000 [IU] | PEN_INJECTOR | Freq: Every day | SUBCUTANEOUS | 2 refills | Status: DC

## 2019-10-29 ENCOUNTER — Encounter: Payer: Self-pay | Admitting: Physician Assistant

## 2019-11-05 ENCOUNTER — Telehealth: Payer: Self-pay

## 2019-11-05 ENCOUNTER — Other Ambulatory Visit: Payer: Self-pay | Admitting: Family Medicine

## 2019-11-05 DIAGNOSIS — E1142 Type 2 diabetes mellitus with diabetic polyneuropathy: Secondary | ICD-10-CM

## 2019-11-05 NOTE — Telephone Encounter (Signed)
I am not sure how to access the My Chart encounter in order to reply to patient but please let him know that I was able to read his message and I placed a referral for him to see a podiatrist

## 2019-11-05 NOTE — Progress Notes (Signed)
Patient ID: Louis [name], male   DOB: [birth year], 29 y.o.   MRN: [id number]   Patient left My Chart message requesting podiatry referral and referral placed

## 2019-11-05 NOTE — Telephone Encounter (Signed)
Pt sent a MyChart message to Marylene Land but you are the pcp. May you please follow up on MyChart messgae

## 2019-11-29 ENCOUNTER — Other Ambulatory Visit: Payer: Self-pay | Admitting: Physician Assistant

## 2019-11-29 DIAGNOSIS — I10 Essential (primary) hypertension: Secondary | ICD-10-CM

## 2019-11-29 MED FILL — LOSARTAN POTASSIUM 100 MG T: 100 | 30 days supply | Qty: 30 | Fill #1

## 2019-11-29 MED FILL — HYDROCHLOROTHIAZIDE 25 MG T: 25 | 30 days supply | Qty: 30 | Fill #0

## 2019-11-29 MED FILL — $LANTUS SOLOSTAR 100 UNITS/: 100 | 34 days supply | Qty: 15 | Fill #0

## 2019-12-30 ENCOUNTER — Other Ambulatory Visit: Payer: Self-pay | Admitting: Family Medicine

## 2019-12-30 DIAGNOSIS — Z794 Long term (current) use of insulin: Secondary | ICD-10-CM

## 2019-12-30 DIAGNOSIS — I10 Essential (primary) hypertension: Secondary | ICD-10-CM

## 2019-12-30 MED ORDER — HYDROCHLOROTHIAZIDE 25 MG PO TABS: 25.0000 mg | ORAL_TABLET | Freq: Every day | ORAL | 0 refills | Status: DC

## 2019-12-30 MED ORDER — INSULIN GLARGINE 100 UNIT/ML SOLOSTAR PEN: 44.0000 [IU] | PEN_INJECTOR | Freq: Every day | SUBCUTANEOUS | 2 refills | Status: DC

## 2019-12-30 MED ORDER — LOSARTAN POTASSIUM 100 MG PO TABS: ORAL_TABLET | ORAL | 1 refills | Status: DC

## 2019-12-30 MED FILL — LOSARTAN POTASSIUM 100 MG T: 100 | 30 days supply | Qty: 30 | Fill #0

## 2019-12-30 MED FILL — HYDROCHLOROTHIAZIDE 25 MG T: 25 | 30 days supply | Qty: 30 | Fill #0

## 2019-12-30 MED FILL — $LANTUS SOLOSTAR 100 UNITS/: 100 | 33 days supply | Qty: 15 | Fill #0

## 2019-12-30 NOTE — Telephone Encounter (Signed)
Copied from CRM [id number]. Topic: Quick Communication - Rx Refill/Question >> Dec 30, 2019 10:07 AM Marylen Ponto wrote: Medication: hydrochlorothiazide (HYDRODIURIL) 25 MG tablet, insulin glargine (LANTUS) 100 UNIT/ML Solostar Pen, and losartan (COZAAR) 100 MG tablet  Has the patient contacted their pharmacy? no  Preferred Pharmacy (with phone number or street name): [facility] - [place], [place] - [address]  Phone: [phone]  Fax: [phone]  Agent: Please be advised that RX refills may take up to 3 business days. We ask that you follow-up with your pharmacy.

## 2020-01-28 ENCOUNTER — Other Ambulatory Visit: Payer: Self-pay | Admitting: Family Medicine

## 2020-01-28 DIAGNOSIS — Z794 Long term (current) use of insulin: Secondary | ICD-10-CM

## 2020-01-28 DIAGNOSIS — I10 Essential (primary) hypertension: Secondary | ICD-10-CM

## 2020-01-28 DIAGNOSIS — E119 Type 2 diabetes mellitus without complications: Secondary | ICD-10-CM

## 2020-01-28 MED ORDER — INSULIN GLARGINE 100 UNIT/ML SOLOSTAR PEN: 44.0000 [IU] | PEN_INJECTOR | Freq: Every day | SUBCUTANEOUS | 2 refills | Status: DC

## 2020-01-28 MED ORDER — HYDROCHLOROTHIAZIDE 25 MG PO TABS: 25.0000 mg | ORAL_TABLET | Freq: Every day | ORAL | 1 refills | Status: DC

## 2020-01-28 MED ORDER — LOSARTAN POTASSIUM 100 MG PO TABS: ORAL_TABLET | ORAL | 1 refills | Status: DC

## 2020-01-28 MED FILL — HYDROCHLOROTHIAZIDE 25 MG T: 25 | 30 days supply | Qty: 30 | Fill #0

## 2020-01-28 MED FILL — $LANTUS SOLOSTAR 100 UNITS/: 100 | 27 days supply | Qty: 12 | Fill #0

## 2020-01-28 MED FILL — LOSARTAN POTASSIUM 100 MG T: 100 | 30 days supply | Qty: 30 | Fill #0

## 2020-01-28 NOTE — Telephone Encounter (Signed)
Copied from CRM [id number]. Topic: Quick Communication - Rx Refill/Question >> Jan 28, 2020  3:22 PM Mcneil, Ja-Kwan wrote: Medication: hydrochlorothiazide (HYDRODIURIL) 25 MG tablet, insulin glargine (LANTUS) 100 UNIT/ML Solostar Pen, and losartan (COZAAR) 100 MG tablet  Has the patient contacted their pharmacy? No  Preferred Pharmacy (with phone number or street name): [facility] - [place], [place] [address]  Phone: [phone]  Fax: [phone]  Agent: Please be advised that RX refills may take up to 3 business days. We ask that you follow-up with your pharmacy.

## 2020-01-28 NOTE — Telephone Encounter (Signed)
Requested Prescriptions  Pending Prescriptions Disp Refills  . hydrochlorothiazide (HYDRODIURIL) 25 MG tablet 90 tablet 1    Sig: Take 1 tablet (25 mg total) by mouth daily.     Cardiovascular: Diuretics - Thiazide Failed - 01/28/2020  3:28 PM      Failed - Last BP in normal range    BP Readings from Last 1 Encounters:  10/27/19 (!) 153/88         Passed - Ca in normal range and within 360 days    Calcium  Date Value Ref Range Status  10/27/2019 8.9 8.7 - 10.2 mg/dL Final         Passed - Cr in normal range and within 360 days    Creatinine, Ser  Date Value Ref Range Status  10/27/2019 0.83 0.76 - 1.27 mg/dL Final         Passed - K in normal range and within 360 days    Potassium  Date Value Ref Range Status  10/27/2019 4.4 3.5 - 5.2 mmol/L Final         Passed - Na in normal range and within 360 days    Sodium  Date Value Ref Range Status  10/27/2019 143 134 - 144 mmol/L Final         Passed - Valid encounter within last 6 months    Recent Outpatient Visits          3 months ago Type 2 diabetes mellitus without complication, with long-term current use of insulin [facility])   [place] [facility] [place], [place], [place]   8 months ago No-show for appointment   [facility] Fulp, [place], MD   11 months ago Type 2 diabetes mellitus without complication, with long-term current use of insulin [facility])   [place] [facility] [place], [place], [place]   1 year ago Type 2 diabetes mellitus without complication, with long-term current use of insulin (HCC)   [facility] Fulp, [place], MD   1 year ago Diabetic ketoacidosis without coma associated with type 2 diabetes mellitus [facility])   [place] [facility] Fulp, [place], MD      Future Appointments            In 3 months [place], Marzella Schlein, PA-C [place] [facility]           . losartan  (COZAAR) 100 MG tablet 90 tablet 1    Sig: TAKE 1 TABLET BY MOUTH DAILY.     Cardiovascular:  Angiotensin Receptor Blockers Failed - 01/28/2020  3:28 PM      Failed - Last BP in normal range    BP Readings from Last 1 Encounters:  10/27/19 (!) 153/88         Passed - Cr in normal range and within 180 days    Creatinine, Ser  Date Value Ref Range Status  10/27/2019 0.83 0.76 - 1.27 mg/dL Final         Passed - K in normal range and within 180 days    Potassium  Date Value Ref Range Status  10/27/2019 4.4 3.5 - 5.2 mmol/L Final         Passed - Patient is not pregnant      Passed - Valid encounter within last 6 months    Recent Outpatient Visits          3 months ago Type 2 diabetes  mellitus without complication, with long-term current use of insulin [facility])   [facility] [place], [place], [place]   8 months ago No-show for appointment   [facility] Fulp, [place], MD   11 months ago Type 2 diabetes mellitus without complication, with long-term current use of insulin [facility])   [facility] [facility] [place], [place], [place]   1 year ago Type 2 diabetes mellitus without complication, with long-term current use of insulin (HCC)   [place] [facility] Fulp, [place], MD   1 year ago Diabetic ketoacidosis without coma associated with type 2 diabetes mellitus [facility])   [place] [facility] [place], MD      Future Appointments            In 3 months [place], Marzella Schlein, PA-C [place] [facility]           . insulin glargine (LANTUS) 100 UNIT/ML Solostar Pen 15 mL 2    Sig: Inject 44 Units into the skin daily at 10 pm.     Endocrinology:  Diabetes - Insulins Passed - 01/28/2020  3:28 PM      Passed - HBA1C is between 0 and 7.9 and within 180 days    Hgb A1c MFr Bld  Date Value Ref Range Status  10/27/2019 6.4 (H) 4.8 - 5.6 % Final    Comment:              Prediabetes: 5.7 - 6.4          Diabetes: >6.4          Glycemic control for adults with diabetes: <7.0          Passed - Valid encounter within last 6 months    Recent Outpatient Visits          3 months ago Type 2 diabetes mellitus without complication, with long-term current use of insulin [facility])   [facility] [place], [place], [place]   8 months ago No-show for appointment   [facility] Fulp, [place], MD   11 months ago Type 2 diabetes mellitus without complication, with long-term current use of insulin [facility])   [place] [facility] [place], [place], [place]   1 year ago Type 2 diabetes mellitus without complication, with long-term current use of insulin (HCC)   [place] [facility] Fulp, [place], MD   1 year ago Diabetic ketoacidosis without coma associated with type 2 diabetes mellitus [facility])   [place] [facility] Fulp, [place], MD      Future Appointments            In 3 months [place], Marzella Schlein, PA-C  [facility]            Phone call to pt. To schedule 6 mo. F/u; appt. Sched. 05/03/20 @ 10:30 AM.  Refills given per protocol.

## 2020-02-26 IMAGING — CR DG CHEST 2V
2 series · 2 of 2 positions shown · non-contrast
Comparison: None.

CLINICAL DATA: Midline chest pain, nausea/vomiting

EXAM:
CHEST - 2 VIEW

[chest lat]
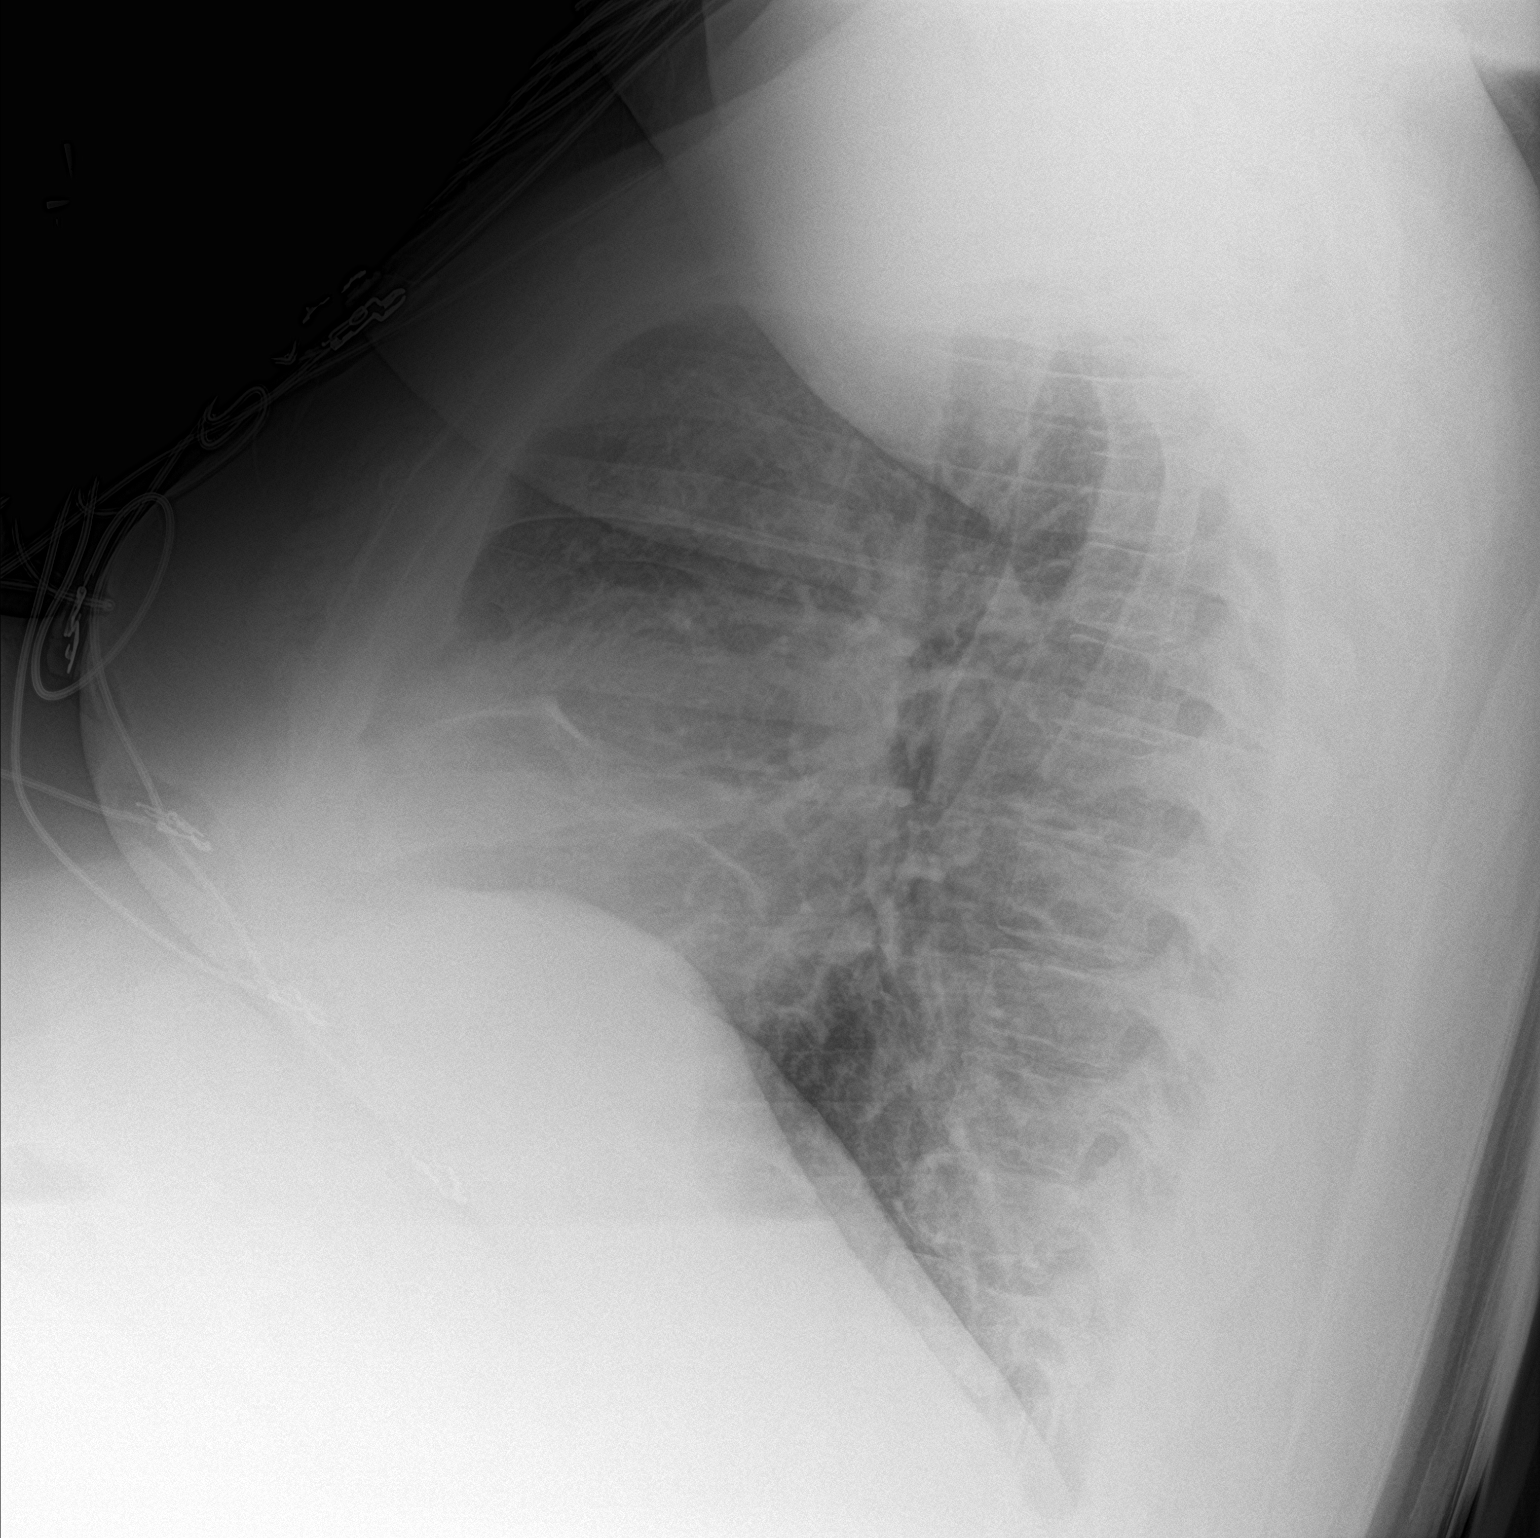

[chest ap]
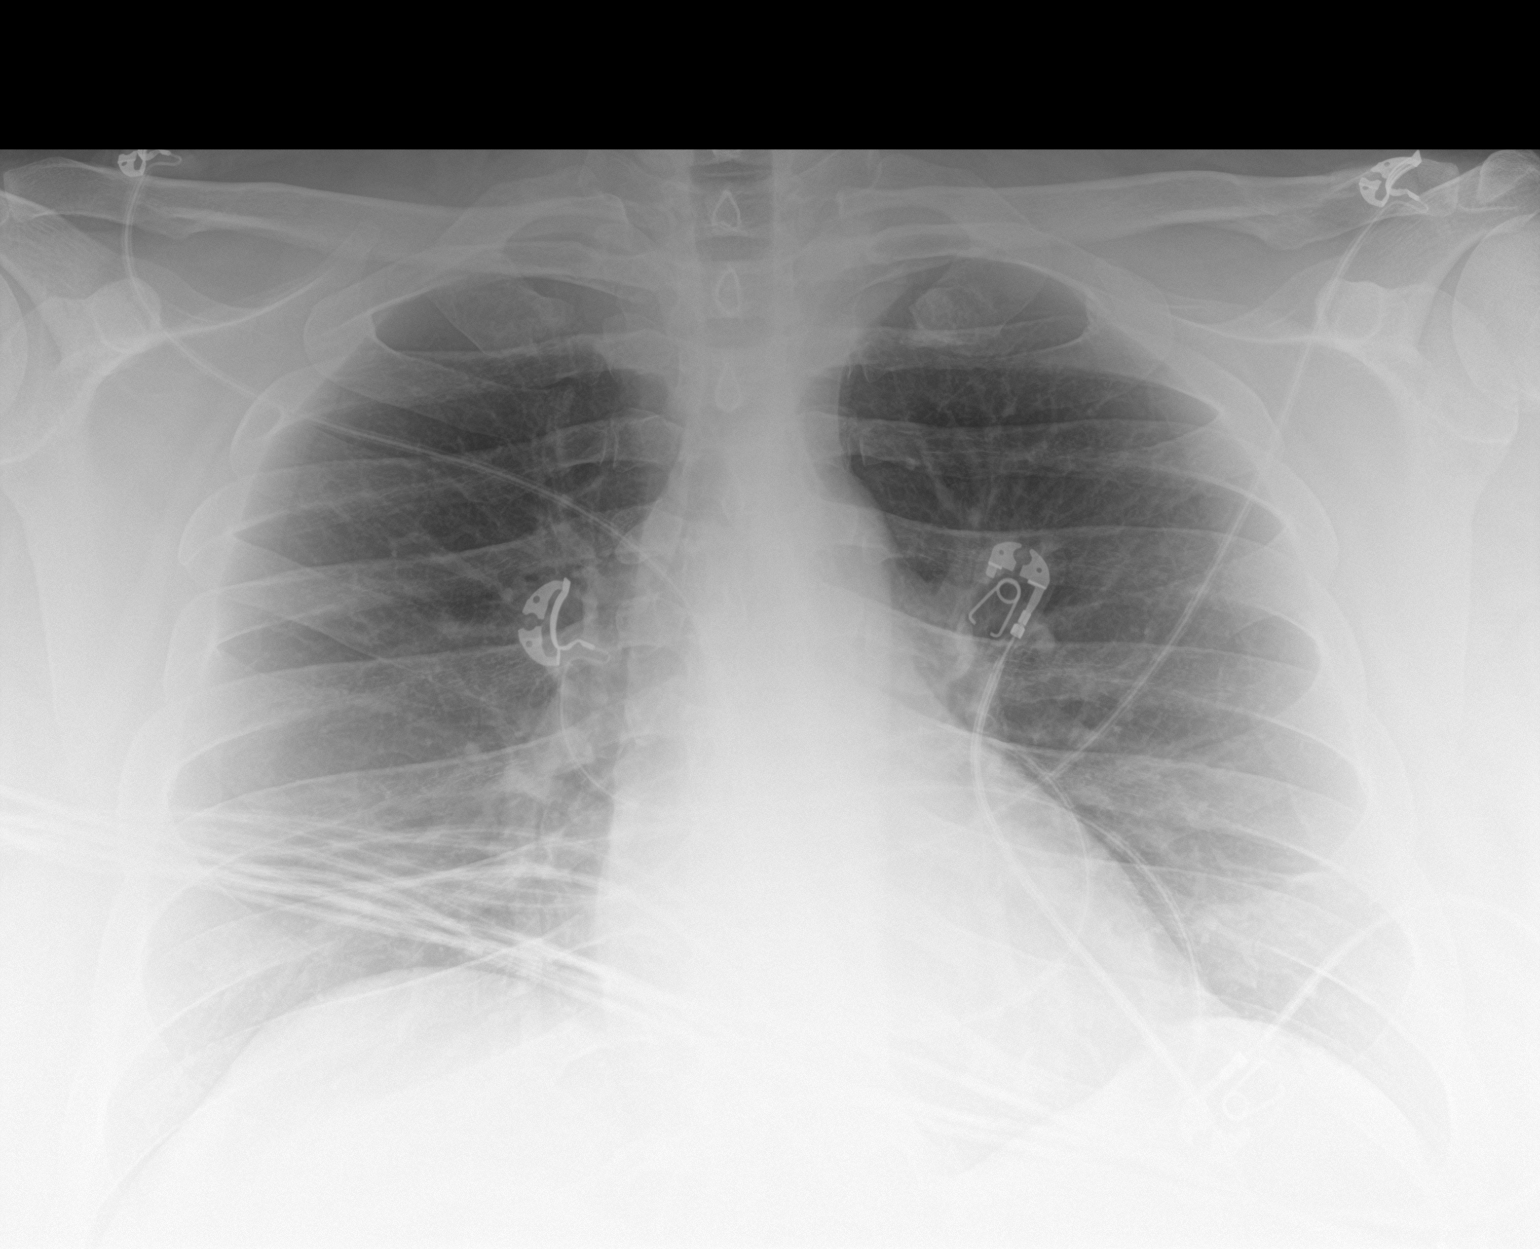

[2 of 2 positions shown; findings below may reference images not displayed]

FINDINGS: Lungs are clear.  No pleural effusion or pneumothorax.

The heart is normal in size.

Visualized osseous structures are within normal limits.
IMPRESSION: Normal chest radiographs.

## 2020-03-01 MED FILL — $LANTUS SOLOSTAR 100 UNITS/: 100 | 47 days supply | Qty: 21 | Fill #1

## 2020-03-16 LAB — HM DIABETES EYE EXAM

## 2020-04-24 MED FILL — $LANTUS SOLOSTAR 100 UNITS/: 100 | 33 days supply | Qty: 15 | Fill #1

## 2020-05-03 ENCOUNTER — Ambulatory Visit: Payer: Self-pay | Admitting: Physician Assistant

## 2020-05-04 ENCOUNTER — Ambulatory Visit: Payer: Self-pay | Admitting: Internal Medicine

## 2020-05-09 ENCOUNTER — Other Ambulatory Visit: Payer: Self-pay

## 2020-05-09 ENCOUNTER — Ambulatory Visit: Payer: Non-veteran care | Attending: Critical Care Medicine | Admitting: Critical Care Medicine

## 2020-05-09 ENCOUNTER — Encounter: Payer: Self-pay | Admitting: Critical Care Medicine

## 2020-05-09 VITALS — BP 126/69 | HR 97 | Wt >= 6400 oz

## 2020-05-09 DIAGNOSIS — I10 Essential (primary) hypertension: Secondary | ICD-10-CM | POA: Diagnosis not present

## 2020-05-09 DIAGNOSIS — Z794 Long term (current) use of insulin: Secondary | ICD-10-CM | POA: Diagnosis not present

## 2020-05-09 DIAGNOSIS — G4733 Obstructive sleep apnea (adult) (pediatric): Secondary | ICD-10-CM | POA: Insufficient documentation

## 2020-05-09 DIAGNOSIS — Z1159 Encounter for screening for other viral diseases: Secondary | ICD-10-CM | POA: Diagnosis not present

## 2020-05-09 DIAGNOSIS — R0683 Snoring: Secondary | ICD-10-CM

## 2020-05-09 DIAGNOSIS — Z6841 Body Mass Index (BMI) 40.0 and over, adult: Secondary | ICD-10-CM

## 2020-05-09 DIAGNOSIS — E119 Type 2 diabetes mellitus without complications: Secondary | ICD-10-CM

## 2020-05-09 LAB — POCT GLYCOSYLATED HEMOGLOBIN (HGB A1C): Hemoglobin A1C: 6.7 % — AB (ref 4.0–5.6)

## 2020-05-09 LAB — GLUCOSE, POCT (MANUAL RESULT ENTRY): POC Glucose: 117 mg/dl — AB (ref 70–99)

## 2020-05-09 MED ORDER — INSULIN GLARGINE 100 UNIT/ML SOLOSTAR PEN: 42.0000 [IU] | PEN_INJECTOR | Freq: Every day | SUBCUTANEOUS | 1 refills | Status: DC

## 2020-05-09 MED ORDER — HYDROCHLOROTHIAZIDE 25 MG PO TABS: 25.0000 mg | ORAL_TABLET | Freq: Every day | ORAL | 1 refills | Status: DC

## 2020-05-09 MED ORDER — LOSARTAN POTASSIUM 100 MG PO TABS: ORAL_TABLET | ORAL | 1 refills | Status: DC

## 2020-05-09 MED ORDER — TRUEPLUS PEN NEEDLES 32G X 4 MM MISC: 11 refills | Status: AC

## 2020-05-09 NOTE — Progress Notes (Addendum)
Subjective:    Patient ID: Louis [name], male    DOB: April 16, 1991, 29 y.o.   MRN: [id number]  29 y.o.M here to be cleared for dental care procedure.  Hx HTN T2DM  This patient has a history of morbid obesity weighs over 500 pounds and has a history of type 2 diabetes.  On arrival blood pressure is 126/69 blood sugar though was 170 hemoglobin A1c 6.7  Patient is currently on Metformin and Lantus.  Note last LDL was checked at 70 and is not on cholesterol therapy.  He does have snoring he complains of some foot pain note he is yet to receive a Covid vaccine he is declined the flu vaccine  Patient has no other complaints at this time   Past Medical History:  Diagnosis Date  . Asthma   . Diabetes mellitus without complication (HCC)   . Diabetic ketoacidosis (HCC) 06/22/2018  . Hypertension      Family History  Problem Relation Age of Onset  . Diabetes Father   . Diabetes Brother      Social History   Socioeconomic History  . Marital status: Single    Spouse name: Not on file  . Number of children: Not on file  . Years of education: Not on file  . Highest education level: Not on file  Occupational History  . Not on file  Tobacco Use  . Smoking status: Former Games developer  . Smokeless tobacco: Never Used  Vaping Use  . Vaping Use: Never used  Substance and Sexual Activity  . Alcohol use: Not Currently  . Drug use: Yes    Types: Marijuana  . Sexual activity: Yes  Other Topics Concern  . Not on file  Social History Narrative  . Not on file   Social Determinants of Health   Financial Resource Strain:   . Difficulty of Paying Living Expenses: Not on file  Food Insecurity:   . Worried About Programme researcher, broadcasting/film/video in the Last Year: Not on file  . Ran Out of Food in the Last Year: Not on file  Transportation Needs:   . Lack of Transportation (Medical): Not on file  . Lack of Transportation (Non-Medical): Not on file  Physical Activity:   . Days of Exercise per Week: Not on  file  . Minutes of Exercise per Session: Not on file  Stress:   . Feeling of Stress : Not on file  Social Connections:   . Frequency of Communication with Friends and Family: Not on file  . Frequency of Social Gatherings with Friends and Family: Not on file  . Attends Religious Services: Not on file  . Active Member of Clubs or Organizations: Not on file  . Attends Banker Meetings: Not on file  . Marital Status: Not on file  Intimate Partner Violence:   . Fear of Current or Ex-Partner: Not on file  . Emotionally Abused: Not on file  . Physically Abused: Not on file  . Sexually Abused: Not on file     Allergies  Allergen Reactions  . Latex Rash     Outpatient Medications Prior to Visit  Medication Sig Dispense Refill  . glucose blood (TRUE METRIX BLOOD GLUCOSE TEST) test strip Use as directed three times daily to check blood sugar 100 each 11  . loratadine (CLARITIN) 10 MG tablet TAKE 1 TABLET (10 MG TOTAL) BY MOUTH DAILY. AS NEEDED FOR NASAL CONGESTION 30 tablet 2  . TRUEPLUS LANCETS 28G MISC Use  as directed three times daily to check blood sugar 100 each 11  . hydrochlorothiazide (HYDRODIURIL) 25 MG tablet Take 1 tablet (25 mg total) by mouth daily. 90 tablet 1  . insulin glargine (LANTUS) 100 UNIT/ML Solostar Pen Inject 44 Units into the skin daily at 10 pm. (Patient taking differently: Inject 42 Units into the skin daily. ) 15 mL 2  . Insulin Pen Needle (TRUEPLUS PEN NEEDLES) 32G X 4 MM MISC Use as directed daily for insulin administration. 100 each 11  . losartan (COZAAR) 100 MG tablet TAKE 1 TABLET BY MOUTH DAILY. 90 tablet 1   No facility-administered medications prior to visit.     Review of Systems  Constitutional: Positive for activity change.  HENT: Negative.   Respiratory: Negative.   Cardiovascular: Negative.   Gastrointestinal: Negative.   Genitourinary: Negative.   Musculoskeletal: Negative.   Neurological: Negative.   Hematological: Negative.    Psychiatric/Behavioral: Negative.        Objective:   Physical Exam Vitals:   05/09/20 1404  BP: 126/69  Pulse: 97  SpO2: 100%  Weight: (!) 509 lb (230.9 kg)    Gen: Pleasant, morbidly obese, in no distress,  normal affect  ENT: No lesions,  mouth clear,  oropharynx clear, no postnasal drip  Neck: No JVD, no TMG, no carotid bruits  Lungs: No use of accessory muscles, no dullness to percussion, clear without rales or rhonchi  Cardiovascular: RRR, heart sounds normal, no murmur or gallops, no peripheral edema  Abdomen: soft and NT, no HSM,  BS normal  Musculoskeletal: No deformities, no cyanosis or clubbing  Neuro: alert, non focal  Skin: Warm, no lesions or rashes       Assessment & Plan:  I personally reviewed all images and lab data in the [facility] system as well as any outside material available during this office visit and agree with the  radiology impressions.   Essential hypertension Plan to renew losartan and hydrochlorothiazide refills given no change in dose blood pressure good control  Type 2 diabetes mellitus without complication, with long-term current use of insulin (HCC) Type 2 diabetes adequate control but could improve some  Continue Lantus 42 units daily continue glucose monitoring  Chronic foot pain refer to podiatry  Morbid obesity with BMI of 70 and over, adult (HCC) Morbid obesity weight over 500 pounds  Plan here to refer to medical weight management patient given dietary recommendations  Snoring Active snoring probable sleep apnea refer to sleep medicine   Louis [name] was seen today for surgical clearance.  Diagnoses and all orders for this visit:  Type 2 diabetes mellitus without complication, with long-term current use of insulin (HCC) -     POCT glucose (manual entry) -     POCT glycosylated hemoglobin (Hb A1C) -     Ambulatory referral to Podiatry -     Comprehensive metabolic panel -     Lipid panel -     insulin glargine  (LANTUS) 100 UNIT/ML Solostar Pen; Inject 42 Units into the skin daily. -     Insulin Pen Needle (TRUEPLUS PEN NEEDLES) 32G X 4 MM MISC; Use as directed daily for insulin administration.  Need for hepatitis C screening test -     HCV Ab w Reflex to Quant PCR  Primary hypertension -     CBC with Differential/Platelet  Morbid obesity with BMI of 70 and over, adult (HCC) -     Amb Ref to Medical Weight Management -  Ambulatory referral to Pulmonology  Snoring -     Ambulatory referral to Pulmonology  Essential hypertension -     hydrochlorothiazide (HYDRODIURIL) 25 MG tablet; Take 1 tablet (25 mg total) by mouth daily. -     losartan (COZAAR) 100 MG tablet; TAKE 1 TABLET BY MOUTH DAILY.  Screen for hepatitis C patient to receive Covid vaccine he agrees to do this Patient is cleared for upcoming dental procedure

## 2020-05-09 NOTE — Patient Instructions (Signed)
Referral to podiatry will be made Referral to sleep medicine will be made Referral to weight management will be made  Labs today to  check your cholesterol, blood counts, metabolic panel and screening for hepatitis C  You are cleared for planned dental work  No change in blood pressure medicines or diabetic medicines at this time  Return to see Dr. Delford Field 2 months  Reviewed diet below   Diabetes Mellitus and Nutrition, Adult When you have diabetes (diabetes mellitus), it is very important to have healthy eating habits because your blood sugar (glucose) levels are greatly affected by what you eat and drink. Eating healthy foods in the appropriate amounts, at about the same times every day, can help you:  Control your blood glucose.  Lower your risk of heart disease.  Improve your blood pressure.  Reach or maintain a healthy weight. Every person with diabetes is different, and each person has different needs for a meal plan. Your health care provider may recommend that you work with a diet and nutrition specialist (dietitian) to make a meal plan that is best for you. Your meal plan may vary depending on factors such as:  The calories you need.  The medicines you take.  Your weight.  Your blood glucose, blood pressure, and cholesterol levels.  Your activity level.  Other health conditions you have, such as heart or kidney disease. How do carbohydrates affect me? Carbohydrates, also called carbs, affect your blood glucose level more than any other type of food. Eating carbs naturally raises the amount of glucose in your blood. Carb counting is a method for keeping track of how many carbs you eat. Counting carbs is important to keep your blood glucose at a healthy level, especially if you use insulin or take certain oral diabetes medicines. It is important to know how many carbs you can safely have in each meal. This is different for every person. Your dietitian can help you  calculate how many carbs you should have at each meal and for each snack. Foods that contain carbs include:  Bread, cereal, rice, pasta, and crackers.  Potatoes and corn.  Peas, beans, and lentils.  Milk and yogurt.  Fruit and juice.  Desserts, such as cakes, cookies, ice cream, and candy. How does alcohol affect me? Alcohol can cause a sudden decrease in blood glucose (hypoglycemia), especially if you use insulin or take certain oral diabetes medicines. Hypoglycemia can be a life-threatening condition. Symptoms of hypoglycemia (sleepiness, dizziness, and confusion) are similar to symptoms of having too much alcohol. If your health care provider says that alcohol is safe for you, follow these guidelines:  Limit alcohol intake to no more than 1 drink per day for nonpregnant women and 2 drinks per day for men. One drink equals 12 oz of beer, 5 oz of wine, or 1 oz of hard liquor.  Do not drink on an empty stomach.  Keep yourself hydrated with water, diet soda, or unsweetened iced tea.  Keep in mind that regular soda, juice, and other mixers may contain a lot of sugar and must be counted as carbs. What are tips for following this plan?  Reading food labels  Start by checking the serving size on the "Nutrition Facts" label of packaged foods and drinks. The amount of calories, carbs, fats, and other nutrients listed on the label is based on one serving of the item. Many items contain more than one serving per package.  Check the total grams (g) of carbs in  one serving. You can calculate the number of servings of carbs in one serving by dividing the total carbs by 15. For example, if a food has 30 g of total carbs, it would be equal to 2 servings of carbs.  Check the number of grams (g) of saturated and trans fats in one serving. Choose foods that have low or no amount of these fats.  Check the number of milligrams (mg) of salt (sodium) in one serving. Most people should limit total  sodium intake to less than 2,300 mg per day.  Always check the nutrition information of foods labeled as "low-fat" or "nonfat". These foods may be higher in added sugar or refined carbs and should be avoided.  Talk to your dietitian to identify your daily goals for nutrients listed on the label. Shopping  Avoid buying canned, premade, or processed foods. These foods tend to be high in fat, sodium, and added sugar.  Shop around the outside edge of the grocery store. This includes fresh fruits and vegetables, bulk grains, fresh meats, and fresh dairy. Cooking  Use low-heat cooking methods, such as baking, instead of high-heat cooking methods like deep frying.  Cook using healthy oils, such as olive, canola, or sunflower oil.  Avoid cooking with butter, cream, or high-fat meats. Meal planning  Eat meals and snacks regularly, preferably at the same times every day. Avoid going long periods of time without eating.  Eat foods high in fiber, such as fresh fruits, vegetables, beans, and whole grains. Talk to your dietitian about how many servings of carbs you can eat at each meal.  Eat 4-6 ounces (oz) of lean protein each day, such as lean meat, chicken, fish, eggs, or tofu. One oz of lean protein is equal to: ? 1 oz of meat, chicken, or fish. ? 1 egg. ?  cup of tofu.  Eat some foods each day that contain healthy fats, such as avocado, nuts, seeds, and fish. Lifestyle  Check your blood glucose regularly.  Exercise regularly as told by your health care provider. This may include: ? 150 minutes of moderate-intensity or vigorous-intensity exercise each week. This could be brisk walking, biking, or water aerobics. ? Stretching and doing strength exercises, such as yoga or weightlifting, at least 2 times a week.  Take medicines as told by your health care provider.  Do not use any products that contain nicotine or tobacco, such as cigarettes and e-cigarettes. If you need help quitting, ask  your health care provider.  Work with a Social worker or diabetes educator to identify strategies to manage stress and any emotional and social challenges. Questions to ask a health care provider  Do I need to meet with a diabetes educator?  Do I need to meet with a dietitian?  What number can I call if I have questions?  When are the best times to check my blood glucose? Where to find more information:  American Diabetes Association: diabetes.org  Academy of Nutrition and Dietetics: www.eatright.[employer] of Diabetes and Digestive and Kidney Diseases (NIH): [employer] Summary  A healthy meal plan will help you control your blood glucose and maintain a healthy lifestyle.  Working with a diet and nutrition specialist (dietitian) can help you make a meal plan that is best for you.  Keep in mind that carbohydrates (carbs) and alcohol have immediate effects on your blood glucose levels. It is important to count carbs and to use alcohol carefully. This information is not intended to replace advice given  to you by your health care provider. Make sure you discuss any questions you have with your health care provider. Document Revised: 05/16/2017 Document Reviewed: 07/08/2016 Elsevier Patient Education  2020 [employer].

## 2020-05-09 NOTE — Assessment & Plan Note (Addendum)
Type 2 diabetes adequate control but could improve some  Continue Lantus 42 units daily continue glucose monitoring  Chronic foot pain refer to podiatry

## 2020-05-09 NOTE — Assessment & Plan Note (Signed)
Active snoring probable sleep apnea refer to sleep medicine

## 2020-05-09 NOTE — Progress Notes (Signed)
Dental procedure

## 2020-05-09 NOTE — Assessment & Plan Note (Signed)
Plan to renew losartan and hydrochlorothiazide refills given no change in dose blood pressure good control

## 2020-05-09 NOTE — Assessment & Plan Note (Signed)
Morbid obesity weight over 500 pounds  Plan here to refer to medical weight management patient given dietary recommendations

## 2020-05-10 ENCOUNTER — Telehealth: Payer: Self-pay

## 2020-05-10 ENCOUNTER — Ambulatory Visit: Payer: Self-pay | Admitting: Family Medicine

## 2020-05-10 LAB — HCV INTERPRETATION

## 2020-05-10 LAB — COMPREHENSIVE METABOLIC PANEL
ALT: 20 IU/L (ref 0–44)
AST: 23 IU/L (ref 0–40)
Albumin/Globulin Ratio: 1.1 — ABNORMAL LOW (ref 1.2–2.2)
Albumin: 4.4 g/dL (ref 4.0–5.0)
Alkaline Phosphatase: 94 IU/L (ref 44–121)
BUN/Creatinine Ratio: 13 (ref 9–20)
BUN: 10 mg/dL (ref 6–20)
Bilirubin Total: 0.4 mg/dL (ref 0.0–1.2)
CO2: 24 mmol/L (ref 20–29)
Calcium: 9.5 mg/dL (ref 8.7–10.2)
Chloride: 101 mmol/L (ref 96–106)
Creatinine, Ser: 0.79 mg/dL (ref 0.76–1.27)
GFR calc Af Amer: 138 mL/min/{1.73_m2} (ref >59)
GFR calc non Af Amer: 120 mL/min/{1.73_m2} (ref >59)
Globulin, Total: 4 g/dL (ref 1.5–4.5)
Glucose: 106 mg/dL — ABNORMAL HIGH (ref 65–99)
Potassium: 3.9 mmol/L (ref 3.5–5.2)
Sodium: 139 mmol/L (ref 134–144)
Total Protein: 8.4 g/dL (ref 6.0–8.5)

## 2020-05-10 LAB — CBC WITH DIFFERENTIAL/PLATELET
Basophils Absolute: 0.1 10*3/uL (ref 0.0–0.2)
Basos: 1 %
EOS (ABSOLUTE): 0.2 10*3/uL (ref 0.0–0.4)
Eos: 2 %
Hematocrit: 39.6 % (ref 37.5–51.0)
Hemoglobin: 13.3 g/dL (ref 13.0–17.7)
Immature Grans (Abs): 0 10*3/uL (ref 0.0–0.1)
Immature Granulocytes: 0 %
Lymphocytes Absolute: 2.5 10*3/uL (ref 0.7–3.1)
Lymphs: 23 %
MCH: 29.1 pg (ref 26.6–33.0)
MCHC: 33.6 g/dL (ref 31.5–35.7)
MCV: 87 fL (ref 79–97)
Monocytes Absolute: 1.1 10*3/uL — ABNORMAL HIGH (ref 0.1–0.9)
Monocytes: 10 %
Neutrophils Absolute: 7.1 10*3/uL — ABNORMAL HIGH (ref 1.4–7.0)
Neutrophils: 64 %
Platelets: 300 10*3/uL (ref 150–450)
RBC: 4.57 x10E6/uL (ref 4.14–5.80)
RDW: 13.4 % (ref 11.6–15.4)
WBC: 10.8 10*3/uL (ref 3.4–10.8)

## 2020-05-10 LAB — LIPID PANEL
Chol/HDL Ratio: 3.3 ratio (ref 0.0–5.0)
Cholesterol, Total: 176 mg/dL (ref 100–199)
HDL: 53 mg/dL (ref >39)
LDL Chol Calc (NIH): 100 mg/dL — ABNORMAL HIGH (ref 0–99)
Triglycerides: 127 mg/dL (ref 0–149)
VLDL Cholesterol Cal: 23 mg/dL (ref 5–40)

## 2020-05-10 LAB — HCV AB W REFLEX TO QUANT PCR: HCV Ab: <0.1 s/co ratio (ref 0.0–0.9)

## 2020-05-10 NOTE — Telephone Encounter (Signed)
Pt verified. Spoke w/pt advised of blood count normal.Hep C test negative .cholesterol elevated at 100, practice low-fat diet, increase exercise and fiber intake.

## 2020-05-22 ENCOUNTER — Encounter: Payer: Self-pay | Admitting: Podiatry

## 2020-05-22 ENCOUNTER — Other Ambulatory Visit: Payer: Self-pay

## 2020-05-22 ENCOUNTER — Ambulatory Visit (INDEPENDENT_AMBULATORY_CARE_PROVIDER_SITE_OTHER): Payer: Non-veteran care | Admitting: Podiatry

## 2020-05-22 DIAGNOSIS — E08620 Diabetes mellitus due to underlying condition with diabetic dermatitis: Secondary | ICD-10-CM

## 2020-05-22 DIAGNOSIS — Z794 Long term (current) use of insulin: Secondary | ICD-10-CM

## 2020-05-22 DIAGNOSIS — E119 Type 2 diabetes mellitus without complications: Secondary | ICD-10-CM

## 2020-05-22 NOTE — Progress Notes (Signed)
Subjective:   Patient ID: Louis [name], male   DOB: 29 y.o.   MRN: [id number]   HPI Patient presents stating that he has extreme obesity and has been diagnosed with diabetes cannot take care of his feet and wants them evaluated.  He is going to start losing weight and does not currently smoke and is not active   Review of Systems  All other systems reviewed and are negative.       Objective:  Physical Exam Vitals and nursing note reviewed.  Constitutional:      Appearance: He is well-developed.  Pulmonary:     Effort: Pulmonary effort is normal.  Musculoskeletal:        General: Normal range of motion.  Skin:    General: Skin is warm.  Neurological:     Mental Status: He is alert.     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  Patient is encouraged to call with questions concerns

## 2020-06-01 MED FILL — $LANTUS SOLOSTAR 100 UNITS/: 100 | 33 days supply | Qty: 15 | Fill #2

## 2020-06-21 ENCOUNTER — Ambulatory Visit (INDEPENDENT_AMBULATORY_CARE_PROVIDER_SITE_OTHER): Admitting: Pulmonary Disease

## 2020-06-21 ENCOUNTER — Encounter: Payer: Self-pay | Admitting: Pulmonary Disease

## 2020-06-21 ENCOUNTER — Other Ambulatory Visit: Payer: Self-pay

## 2020-06-21 VITALS — BP 120/80 | HR 110 | Temp 97.2°F | Ht 70.0 in | Wt >= 6400 oz

## 2020-06-21 DIAGNOSIS — I10 Essential (primary) hypertension: Secondary | ICD-10-CM | POA: Diagnosis not present

## 2020-06-21 DIAGNOSIS — R0683 Snoring: Secondary | ICD-10-CM

## 2020-06-21 DIAGNOSIS — G4733 Obstructive sleep apnea (adult) (pediatric): Secondary | ICD-10-CM

## 2020-06-21 NOTE — Progress Notes (Signed)
   Subjective:    Patient ID: Louis [name], male    DOB: 1990-12-09, 30 y.o.   MRN: [id number]  HPI   30 year old morbidly obese man presents for evaluation of obstructive sleep apnea. 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 He has the youngest of 8 siblings. Loud snoring has been noted by family members and he always wakes up with a dry mouth.  Epworth sleepiness score is 3 but I wonder if he is underreporting, he denies daytime naps or excessive somnolence he does report decreased energy levels.  He has been occasionally woken up by choking and gasping episodes in his sleep. Bedtime is between 9 and 10 PM, sleep latency is about 30 minutes, he sleeps on his right side with 2-3 pillows, reports 2-3 nocturnal awakenings including nocturia and is out of bed by 7 AM feeling tired with dryness of mouth but denies headaches. His weight has fluctuated between 10 pounds of his current There is no history suggestive of cataplexy, sleep paralysis or parasomnias    Past Medical History:  Diagnosis Date  . Asthma   . Diabetes mellitus without complication (HCC)   . Diabetic ketoacidosis (HCC) 06/22/2018  . Hypertension     Past Surgical History:  Procedure Laterality Date  . LEG SURGERY      Allergies  Allergen Reactions  . Latex Rash    Social History   Socioeconomic History  . Marital status: Single    Spouse name: Not on file  . Number of children: Not on file  . Years of education: Not on file  . Highest education level: Not on file  Occupational History  . Not on file  Tobacco Use  . Smoking status: Former Games developer  . Smokeless tobacco: Never Used  Vaping Use  . Vaping Use: Never used  Substance and Sexual Activity  . Alcohol use: Not Currently  . Drug use: Yes    Types: Marijuana  . Sexual activity: Yes  Other Topics Concern  . Not on file  Social History Narrative  . Not on file   Social  Determinants of Health   Financial Resource Strain: Not on file  Food Insecurity: Not on file  Transportation Needs: Not on file  Physical Activity: Not on file  Stress: Not on file  Social Connections: Not on file  Intimate Partner Violence: Not on file     Family History  Problem Relation Age of Onset  . Diabetes Father   . Diabetes Brother      Review of Systems Nonproductive cough Acid heartburn Nasal congestion and sneezing Feet swelling Joint stiffness    Objective:   Physical Exam  Gen. Pleasant, morbidly obese, in no distress, normal affect ENT - no pallor,icterus, no post nasal drip, class 2-3 airway Neck: No JVD, no thyromegaly, no carotid bruits Lungs: no use of accessory muscles, no dullness to percussion, decreased without rales or rhonchi  Cardiovascular: Rhythm regular, heart sounds  normal, no murmurs or gallops, no peripheral edema Abdomen: soft and non-tender, no hepatosplenomegaly, BS normal. Musculoskeletal: No deformities, no cyanosis or clubbing Neuro:  alert, non focal, no tremors       Assessment & Plan:

## 2020-06-21 NOTE — Assessment & Plan Note (Signed)
Correlation between hypertension and OSA discussed although not causative may be contributory

## 2020-06-21 NOTE — Assessment & Plan Note (Signed)
Given excessive daytime somnolence, narrow pharyngeal exam, witnessed apneas & loud snoring, obstructive sleep apnea is very likely & an overnight polysomnogram will be scheduled as a home study. The pathophysiology of obstructive sleep apnea , it's cardiovascular consequences & modes of treatment including CPAP were discused with the patient in detail & they evidenced understanding.  Last labs were reviewed, serum bicarbonate is 24 so I doubt that he has obesity hypoventilation syndrome.  He may however require high CPAP pressures, may require formal CPAP titration study based on degree of sleep disordered breathing noted on home sleep study

## 2020-06-21 NOTE — Patient Instructions (Signed)
Schedule home sleep test  We discussed treatment options for obstructive sleep apnea

## 2020-07-17 ENCOUNTER — Other Ambulatory Visit: Payer: Self-pay | Admitting: Critical Care Medicine

## 2020-07-17 ENCOUNTER — Other Ambulatory Visit: Payer: Self-pay

## 2020-07-17 ENCOUNTER — Ambulatory Visit: Payer: Non-veteran care

## 2020-07-17 DIAGNOSIS — G4733 Obstructive sleep apnea (adult) (pediatric): Secondary | ICD-10-CM | POA: Diagnosis not present

## 2020-07-17 DIAGNOSIS — Z794 Long term (current) use of insulin: Secondary | ICD-10-CM

## 2020-07-17 DIAGNOSIS — I10 Essential (primary) hypertension: Secondary | ICD-10-CM

## 2020-07-17 DIAGNOSIS — E119 Type 2 diabetes mellitus without complications: Secondary | ICD-10-CM

## 2020-07-17 DIAGNOSIS — R0683 Snoring: Secondary | ICD-10-CM

## 2020-07-17 MED ORDER — INSULIN GLARGINE 100 UNIT/ML SOLOSTAR PEN: 42.0000 [IU] | PEN_INJECTOR | Freq: Every day | SUBCUTANEOUS | 0 refills | Status: DC

## 2020-07-17 NOTE — Telephone Encounter (Signed)
Medication: insulin glargine (LANTUS) 100 UNIT/ML Solostar Pen [[id number]]  ENDED  Has the patient contacted their pharmacy? YES (Agent: If no, request that the patient contact the pharmacy for the refill.) (Agent: If yes, when and what did the pharmacy advise?)  Preferred Pharmacy (with phone number or street name): [facility] - [place], [place] - [place] [address] [place] [address] Phone: [phone] Fax: [phone] Hours: Not open 24 hours    Agent: Please be advised that RX refills may take up to 3 business days. We ask that you follow-up with your pharmacy.

## 2020-07-17 NOTE — Telephone Encounter (Signed)
Medication: losartan (COZAAR) 100 MG tablet [[id number]] , hydrochlorothiazide (HYDRODIURIL) 25 MG tablet [[id number]]   Has the patient contacted their pharmacy? YES (Agent: If no, request that the patient contact the pharmacy for the refill.) (Agent: If yes, when and what did the pharmacy advise?)  Preferred Pharmacy (with phone number or street name): [id number] [place], [place] - [address] AT [facility] OF [address] & [facility] [address] [place] [address] Phone: [phone] Fax: [phone] Hours: Not open 24 hours    Agent: Please be advised that RX refills may take up to 3 business days. We ask that you follow-up with your pharmacy.

## 2020-07-19 ENCOUNTER — Telehealth: Payer: Self-pay | Admitting: Pulmonary Disease

## 2020-07-19 DIAGNOSIS — G4733 Obstructive sleep apnea (adult) (pediatric): Secondary | ICD-10-CM

## 2020-07-19 NOTE — Telephone Encounter (Signed)
  HST showed mod  OSA with AHI 14/ hr Suggest autoCPAP  5-20 cm, mask of choice OV with me/APP in 6 wks after starting PAP

## 2020-07-20 DIAGNOSIS — E119 Type 2 diabetes mellitus without complications: Secondary | ICD-10-CM

## 2020-07-20 DIAGNOSIS — Z794 Long term (current) use of insulin: Secondary | ICD-10-CM

## 2020-07-20 MED ORDER — INSULIN GLARGINE 100 UNIT/ML SOLOSTAR PEN: 42.0000 [IU] | PEN_INJECTOR | Freq: Every day | SUBCUTANEOUS | 1 refills | Status: DC

## 2020-07-20 NOTE — Telephone Encounter (Signed)
mychart message was sent by pt requesting to know the sleep study results. I have sent the results to pt in this message. Nothing further needed.

## 2020-07-20 NOTE — Telephone Encounter (Signed)
Attempted to call patient to go over HST results, no answer and no voicemail available. Will try again at another time.

## 2020-07-20 NOTE — Telephone Encounter (Signed)
Order for CPAP set up has been placed. Advised patient of delay in getting machine. Nothing further needed at this time.

## 2020-07-21 ENCOUNTER — Other Ambulatory Visit: Payer: Self-pay | Admitting: Critical Care Medicine

## 2020-07-21 DIAGNOSIS — Z794 Long term (current) use of insulin: Secondary | ICD-10-CM

## 2020-07-21 DIAGNOSIS — E119 Type 2 diabetes mellitus without complications: Secondary | ICD-10-CM

## 2020-07-21 MED ORDER — INSULIN GLARGINE 100 UNIT/ML SOLOSTAR PEN: 42.0000 [IU] | PEN_INJECTOR | Freq: Every day | SUBCUTANEOUS | 1 refills | Status: DC

## 2020-07-21 NOTE — Addendum Note (Signed)
Addended by: Storm Frisk on: 07/21/2020 03:57 PM   Modules accepted: Orders

## 2020-07-22 ENCOUNTER — Encounter (INDEPENDENT_AMBULATORY_CARE_PROVIDER_SITE_OTHER): Payer: Self-pay

## 2020-07-27 ENCOUNTER — Other Ambulatory Visit: Payer: Self-pay | Admitting: Family

## 2020-07-27 DIAGNOSIS — I10 Essential (primary) hypertension: Secondary | ICD-10-CM

## 2020-08-03 ENCOUNTER — Other Ambulatory Visit: Payer: Self-pay | Admitting: Pharmacist

## 2020-08-03 DIAGNOSIS — I10 Essential (primary) hypertension: Secondary | ICD-10-CM

## 2020-08-03 MED ORDER — LOSARTAN POTASSIUM 100 MG PO TABS: ORAL_TABLET | ORAL | 0 refills | Status: DC

## 2020-08-03 MED ORDER — HYDROCHLOROTHIAZIDE 25 MG PO TABS: 25.0000 mg | ORAL_TABLET | Freq: Every day | ORAL | 0 refills | Status: DC

## 2020-08-07 ENCOUNTER — Encounter (INDEPENDENT_AMBULATORY_CARE_PROVIDER_SITE_OTHER): Payer: Self-pay

## 2020-08-07 ENCOUNTER — Telehealth: Payer: Self-pay | Admitting: Pharmacist

## 2020-08-07 ENCOUNTER — Ambulatory Visit (INDEPENDENT_AMBULATORY_CARE_PROVIDER_SITE_OTHER): Payer: Non-veteran care | Admitting: Bariatrics

## 2020-08-07 MED ORDER — VALSARTAN 160 MG PO TABS: 160.0000 mg | ORAL_TABLET | Freq: Every day | ORAL | 3 refills | Status: DC

## 2020-08-07 NOTE — Telephone Encounter (Signed)
Let pt know I sent valsartan 160 mg daily to replace the losartan.

## 2020-08-07 NOTE — Addendum Note (Signed)
Addended by: Storm Frisk on: 08/07/2020 03:50 PM   Modules accepted: Orders

## 2020-08-07 NOTE — Telephone Encounter (Signed)
Received notification from patient's [facility] pharmacy that losartan is on backorder. They are requesting a new rx for another agent. Will forward request to PCP for review.

## 2020-08-07 NOTE — Telephone Encounter (Signed)
I called the patient and informed him of this change.

## 2020-08-21 ENCOUNTER — Ambulatory Visit (INDEPENDENT_AMBULATORY_CARE_PROVIDER_SITE_OTHER): Payer: Non-veteran care | Admitting: Bariatrics

## 2020-08-22 ENCOUNTER — Other Ambulatory Visit: Payer: Self-pay

## 2020-08-22 DIAGNOSIS — I10 Essential (primary) hypertension: Secondary | ICD-10-CM

## 2020-08-22 MED ORDER — VALSARTAN-HYDROCHLOROTHIAZIDE 160-25 MG PO TABS: 1.0000 | ORAL_TABLET | Freq: Every day | ORAL | 2 refills | Status: DC

## 2020-08-22 NOTE — Telephone Encounter (Signed)
Rx faxed to [facility] by mail

## 2020-08-22 NOTE — Progress Notes (Signed)
Rx resent escibe not printed

## 2020-08-22 NOTE — Telephone Encounter (Signed)
Pt wanting valsartan HCT printed and [facility]ed [facility]   I have printed the Rx for my CMA to handle

## 2020-08-22 NOTE — Progress Notes (Signed)
Valsartan-HCTZ reordered sent to [facility] through [facility]

## 2020-09-06 ENCOUNTER — Telehealth: Payer: Self-pay | Admitting: Critical Care Medicine

## 2020-09-06 NOTE — Telephone Encounter (Signed)
  Valsartan 160 mg Oral Daily      Pt  Has called in demanding to immediately speak to Dr Delford Field stating that he has not returned his call in order for a full explanation of med change above. Pt was so upset and using abusive language that he would not accept an offer for nurse call back. He is making threats to if within the hr if does not get a CB he is going to call Dr Lynelle Doctor boss, vulgar language. CB is [phone] PT has no interest in speaking to a nurse. Pt did state he does communicate on MyChart so maybe an option. Pt not tolerant of any suggestions and ended call.

## 2020-09-07 MED ORDER — LOSARTAN POTASSIUM-HCTZ 100-25 MG PO TABS: 1.0000 | ORAL_TABLET | Freq: Every day | ORAL | 3 refills | Status: DC

## 2020-09-07 NOTE — Addendum Note (Signed)
Addended by: Shan Levans E on: 09/07/2020 11:24 AM   Modules accepted: Orders

## 2020-09-21 NOTE — Telephone Encounter (Signed)
Called patient regarding his medication and patient states he already spoke with Dr. Delford Field and the medication was called in.

## 2021-01-03 ENCOUNTER — Other Ambulatory Visit: Payer: Self-pay

## 2021-01-03 DIAGNOSIS — E119 Type 2 diabetes mellitus without complications: Secondary | ICD-10-CM

## 2021-01-04 MED ORDER — INSULIN GLARGINE 100 UNIT/ML SOLOSTAR PEN: 42.0000 [IU] | PEN_INJECTOR | Freq: Every day | SUBCUTANEOUS | 1 refills | Status: DC

## 2021-03-23 ENCOUNTER — Other Ambulatory Visit: Payer: Self-pay

## 2021-03-23 DIAGNOSIS — Z794 Long term (current) use of insulin: Secondary | ICD-10-CM

## 2021-03-23 DIAGNOSIS — E119 Type 2 diabetes mellitus without complications: Secondary | ICD-10-CM

## 2021-03-23 NOTE — Telephone Encounter (Signed)
Please refill if appropriate

## 2021-03-24 ENCOUNTER — Other Ambulatory Visit: Payer: Self-pay | Admitting: Family Medicine

## 2021-03-24 DIAGNOSIS — Z794 Long term (current) use of insulin: Secondary | ICD-10-CM

## 2021-03-24 DIAGNOSIS — E119 Type 2 diabetes mellitus without complications: Secondary | ICD-10-CM

## 2021-03-24 MED ORDER — INSULIN GLARGINE 100 UNIT/ML SOLOSTAR PEN: 42.0000 [IU] | PEN_INJECTOR | Freq: Every day | SUBCUTANEOUS | 1 refills | Status: DC

## 2021-05-03 ENCOUNTER — Other Ambulatory Visit: Payer: Self-pay | Admitting: Family Medicine

## 2021-05-03 DIAGNOSIS — E119 Type 2 diabetes mellitus without complications: Secondary | ICD-10-CM

## 2021-05-03 MED ORDER — INSULIN GLARGINE 100 UNIT/ML SOLOSTAR PEN: 42.0000 [IU] | PEN_INJECTOR | Freq: Every day | SUBCUTANEOUS | 1 refills | Status: DC

## 2021-06-13 ENCOUNTER — Other Ambulatory Visit: Payer: Self-pay | Admitting: Critical Care Medicine

## 2021-06-13 DIAGNOSIS — Z794 Long term (current) use of insulin: Secondary | ICD-10-CM

## 2021-06-13 MED ORDER — INSULIN GLARGINE 100 UNIT/ML SOLOSTAR PEN: 42.0000 [IU] | PEN_INJECTOR | Freq: Every day | SUBCUTANEOUS | 1 refills | Status: DC

## 2021-07-10 ENCOUNTER — Other Ambulatory Visit: Payer: Self-pay

## 2021-07-23 ENCOUNTER — Other Ambulatory Visit: Payer: Self-pay | Admitting: Critical Care Medicine

## 2021-07-23 DIAGNOSIS — E119 Type 2 diabetes mellitus without complications: Secondary | ICD-10-CM

## 2021-07-23 DIAGNOSIS — Z794 Long term (current) use of insulin: Secondary | ICD-10-CM

## 2021-07-23 MED ORDER — INSULIN GLARGINE 100 UNIT/ML SOLOSTAR PEN: 42.0000 [IU] | PEN_INJECTOR | Freq: Every day | SUBCUTANEOUS | 1 refills | Status: DC

## 2021-07-31 ENCOUNTER — Other Ambulatory Visit: Payer: Self-pay | Admitting: Critical Care Medicine

## 2021-07-31 NOTE — Telephone Encounter (Signed)
Requested medications are due for refill today.  yes  Requested medications are on the active medications list.  yes  Last refill. 09/07/2020 #90 3 refills  Future visit scheduled.   no  Notes to clinic.  Pt last seen 05/09/2020 - more than 3 months overdue for OV. Labs are expired.    Requested Prescriptions  Pending Prescriptions Disp Refills   HYZAAR 100-25 MG tablet [Pharmacy Med Name: HCTZ 25MG/LOSARTAN 100MG TAB] 90 tablet 3    Sig: TAKE ONE TABLET BY MOUTH EVERY DAY **NOTE MEDICATION CHANGE**     Cardiovascular: ARB + Diuretic Combos Failed - 07/31/2021  9:58 AM      Failed - K in normal range and within 180 days    Potassium  Date Value Ref Range Status  05/09/2020 3.9 3.5 - 5.2 mmol/L Final          Failed - Na in normal range and within 180 days    Sodium  Date Value Ref Range Status  05/09/2020 139 134 - 144 mmol/L Final          Failed - Cr in normal range and within 180 days    Creatinine, Ser  Date Value Ref Range Status  05/09/2020 0.79 0.76 - 1.27 mg/dL Final          Failed - eGFR is 10 or above and within 180 days    GFR calc Af Amer  Date Value Ref Range Status  05/09/2020 138 >59 mL/min/1.73 Final    Comment:    **In accordance with recommendations from the NKF-ASN Task force,**   [facility] is in the process of updating its eGFR calculation to the   2021 CKD-EPI creatinine equation that estimates kidney function   without a race variable.    GFR calc non Af Amer  Date Value Ref Range Status  05/09/2020 120 >59 mL/min/1.73 Final          Failed - Valid encounter within last 6 months    Recent Outpatient Visits           1 year ago Type 2 diabetes mellitus without complication, with long-term current use of insulin ([place])   [place] Elsie Stain, [place]   1 year ago Type 2 diabetes mellitus without complication, with long-term current use of insulin [facility])   [place] E[place],  [place]ionne Bucy, [place]   2 years ago No-show for appointment   [place] Fulp, [place], [place]   2 years ago Type 2 diabetes mellitus without complication, with long-term current use of insulin [facility])   [place], [place], [place]   3 years ago Type 2 diabetes mellitus without complication, with long-term current use of insulin ([place])   [place], [place], [place]              Passed - Patient is not pregnant      Passed - Last BP in normal range    BP Readings from Last 1 Encounters:  06/21/20 120/80

## 2021-09-04 ENCOUNTER — Other Ambulatory Visit: Payer: Self-pay | Admitting: Critical Care Medicine

## 2021-09-04 ENCOUNTER — Encounter: Payer: Self-pay | Admitting: Critical Care Medicine

## 2021-09-04 DIAGNOSIS — E119 Type 2 diabetes mellitus without complications: Secondary | ICD-10-CM

## 2021-09-04 MED ORDER — LOSARTAN POTASSIUM-HCTZ 100-25 MG PO TABS: 1.0000 | ORAL_TABLET | Freq: Every day | ORAL | 3 refills | Status: DC

## 2021-09-04 MED ORDER — INSULIN GLARGINE 100 UNIT/ML SOLOSTAR PEN: 42.0000 [IU] | PEN_INJECTOR | Freq: Every day | SUBCUTANEOUS | 1 refills | Status: DC

## 2021-11-02 ENCOUNTER — Other Ambulatory Visit: Payer: Self-pay

## 2021-11-19 ENCOUNTER — Other Ambulatory Visit: Payer: Self-pay | Admitting: Critical Care Medicine

## 2021-11-19 DIAGNOSIS — E119 Type 2 diabetes mellitus without complications: Secondary | ICD-10-CM

## 2021-11-19 MED ORDER — INSULIN GLARGINE 100 UNIT/ML SOLOSTAR PEN: 42.0000 [IU] | PEN_INJECTOR | Freq: Every day | SUBCUTANEOUS | 1 refills | Status: DC

## 2022-01-23 ENCOUNTER — Encounter (INDEPENDENT_AMBULATORY_CARE_PROVIDER_SITE_OTHER): Payer: Self-pay

## 2022-02-05 ENCOUNTER — Other Ambulatory Visit: Payer: Self-pay | Admitting: Critical Care Medicine

## 2022-02-05 DIAGNOSIS — E119 Type 2 diabetes mellitus without complications: Secondary | ICD-10-CM

## 2022-02-05 MED ORDER — INSULIN GLARGINE 100 UNIT/ML SOLOSTAR PEN: 42.0000 [IU] | PEN_INJECTOR | Freq: Every day | SUBCUTANEOUS | 1 refills | Status: DC

## 2022-05-31 ENCOUNTER — Other Ambulatory Visit: Payer: Self-pay | Admitting: Critical Care Medicine

## 2022-05-31 DIAGNOSIS — E119 Type 2 diabetes mellitus without complications: Secondary | ICD-10-CM

## 2022-05-31 MED ORDER — INSULIN GLARGINE 100 UNIT/ML SOLOSTAR PEN: 42.0000 [IU] | PEN_INJECTOR | Freq: Every day | SUBCUTANEOUS | 1 refills | Status: DC

## 2022-07-17 ENCOUNTER — Encounter: Payer: Self-pay | Admitting: Critical Care Medicine

## 2022-07-17 ENCOUNTER — Other Ambulatory Visit: Payer: Self-pay | Admitting: Critical Care Medicine

## 2022-07-17 DIAGNOSIS — E119 Type 2 diabetes mellitus without complications: Secondary | ICD-10-CM

## 2022-07-17 MED ORDER — LOSARTAN POTASSIUM-HCTZ 100-25 MG PO TABS: 1.0000 | ORAL_TABLET | Freq: Every day | ORAL | 3 refills | Status: DC

## 2022-07-18 MED ORDER — INSULIN GLARGINE 100 UNIT/ML SOLOSTAR PEN: 42.0000 [IU] | PEN_INJECTOR | Freq: Every day | SUBCUTANEOUS | 1 refills | Status: DC

## 2022-09-26 ENCOUNTER — Other Ambulatory Visit: Payer: Self-pay | Admitting: Critical Care Medicine

## 2022-09-26 DIAGNOSIS — Z794 Long term (current) use of insulin: Secondary | ICD-10-CM

## 2022-09-29 MED ORDER — INSULIN GLARGINE 100 UNIT/ML SOLOSTAR PEN: 42.0000 [IU] | PEN_INJECTOR | Freq: Every day | SUBCUTANEOUS | 0 refills | Status: DC

## 2022-11-14 ENCOUNTER — Other Ambulatory Visit: Payer: Self-pay | Admitting: Critical Care Medicine

## 2022-11-14 DIAGNOSIS — E119 Type 2 diabetes mellitus without complications: Secondary | ICD-10-CM

## 2022-11-26 ENCOUNTER — Other Ambulatory Visit: Payer: Self-pay | Admitting: Critical Care Medicine

## 2022-11-26 DIAGNOSIS — E119 Type 2 diabetes mellitus without complications: Secondary | ICD-10-CM

## 2022-11-28 ENCOUNTER — Encounter: Payer: Self-pay | Admitting: Critical Care Medicine

## 2022-11-28 DIAGNOSIS — E119 Type 2 diabetes mellitus without complications: Secondary | ICD-10-CM

## 2022-11-28 MED ORDER — INSULIN GLARGINE 100 UNIT/ML SOLOSTAR PEN: 42.0000 [IU] | PEN_INJECTOR | Freq: Every day | SUBCUTANEOUS | 0 refills | Status: DC

## 2022-12-03 MED ORDER — INSULIN GLARGINE 100 UNIT/ML SOLOSTAR PEN: 42.0000 [IU] | PEN_INJECTOR | Freq: Every day | SUBCUTANEOUS | 1 refills | Status: AC

## 2022-12-03 MED ORDER — LOSARTAN POTASSIUM-HCTZ 100-25 MG PO TABS: 1.0000 | ORAL_TABLET | Freq: Every day | ORAL | 3 refills | Status: AC
